# Patient Record
Sex: Female | Born: 2000 | Race: Black or African American | Hispanic: No | Marital: Single | Smoking: Former smoker
Health system: Southern US, Community
[De-identification: ages and names within clinical notes are randomized; demographics above are authoritative.]

## PROBLEM LIST (undated history)

## (undated) DIAGNOSIS — G35 Multiple sclerosis: Secondary | ICD-10-CM

## (undated) DIAGNOSIS — G35D Multiple sclerosis, unspecified: Secondary | ICD-10-CM

## (undated) DIAGNOSIS — N926 Irregular menstruation, unspecified: Secondary | ICD-10-CM

## (undated) DIAGNOSIS — J45909 Unspecified asthma, uncomplicated: Secondary | ICD-10-CM

## (undated) DIAGNOSIS — H5213 Myopia, bilateral: Secondary | ICD-10-CM

## (undated) DIAGNOSIS — E669 Obesity, unspecified: Secondary | ICD-10-CM

## (undated) DIAGNOSIS — Q6689 Other  specified congenital deformities of feet: Secondary | ICD-10-CM

## (undated) DIAGNOSIS — E7800 Pure hypercholesterolemia, unspecified: Secondary | ICD-10-CM

## (undated) DIAGNOSIS — Z9189 Other specified personal risk factors, not elsewhere classified: Secondary | ICD-10-CM

## (undated) HISTORY — DX: Irregular menstruation, unspecified: N92.6

## (undated) HISTORY — DX: Unspecified asthma, uncomplicated: J45.909

## (undated) HISTORY — DX: Obesity, unspecified: E66.9

## (undated) HISTORY — DX: Other specified personal risk factors, not elsewhere classified: Z91.89

## (undated) HISTORY — DX: Myopia, bilateral: H52.13

## (undated) HISTORY — DX: Pure hypercholesterolemia, unspecified: E78.00

## (undated) HISTORY — DX: Other specified congenital deformities of feet: Q66.89

---
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
## 2018-05-11 ENCOUNTER — Ambulatory Visit: Payer: Medicaid Other | Admitting: Pediatrics

## 2018-05-11 ENCOUNTER — Encounter: Payer: Medicaid Other | Admitting: Licensed Clinical Social Worker

## 2018-07-20 ENCOUNTER — Emergency Department (HOSPITAL_COMMUNITY): Payer: Medicaid Other

## 2018-07-20 ENCOUNTER — Other Ambulatory Visit: Payer: Self-pay

## 2018-07-20 ENCOUNTER — Encounter (HOSPITAL_COMMUNITY): Payer: Self-pay

## 2018-07-20 ENCOUNTER — Emergency Department (HOSPITAL_COMMUNITY)
Admission: EM | Admit: 2018-07-20 | Discharge: 2018-07-20 | Disposition: A | Discharge status: Home / Self Care | Payer: Medicaid Other | Attending: Emergency Medicine | Admitting: Emergency Medicine

## 2018-07-20 DIAGNOSIS — Z7722 Contact with and (suspected) exposure to environmental tobacco smoke (acute) (chronic): Secondary | ICD-10-CM | POA: Insufficient documentation

## 2018-07-20 DIAGNOSIS — J45909 Unspecified asthma, uncomplicated: Secondary | ICD-10-CM | POA: Insufficient documentation

## 2018-07-20 DIAGNOSIS — K5900 Constipation, unspecified: Secondary | ICD-10-CM

## 2018-07-20 DIAGNOSIS — R102 Pelvic and perineal pain: Secondary | ICD-10-CM

## 2018-07-20 DIAGNOSIS — Z79899 Other long term (current) drug therapy: Secondary | ICD-10-CM | POA: Insufficient documentation

## 2018-07-20 DIAGNOSIS — R109 Unspecified abdominal pain: Secondary | ICD-10-CM

## 2018-07-20 LAB — URINALYSIS, ROUTINE W REFLEX MICROSCOPIC
Bilirubin Urine: NEGATIVE
Glucose, UA: NEGATIVE mg/dL
Hgb urine dipstick: NEGATIVE
Ketones, ur: 5 mg/dL — AB
NITRITE: NEGATIVE
PH: 5 (ref 5.0–8.0)
Protein, ur: NEGATIVE mg/dL
Specific Gravity, Urine: 1.026 (ref 1.005–1.030)

## 2018-07-20 LAB — POC URINE PREG, ED: Preg Test, Ur: NEGATIVE

## 2018-07-20 MED ORDER — MAGNESIUM CITRATE PO SOLN: 1.0000 | Freq: Once | ORAL | 0 refills | Status: AC

## 2018-07-20 MED ORDER — POLYETHYLENE GLYCOL 3350 17 G PO PACK: 17.0000 g | PACK | Freq: Every day | ORAL | 0 refills | Status: DC

## 2018-07-20 NOTE — ED Notes (Signed)
Patient transported to Ultrasound 

## 2018-07-20 NOTE — ED Provider Notes (Signed)
[facility] EMERGENCY DEPARTMENT Provider Note   CSN: [id number] Arrival date & time: 07/20/18  1525     History   Chief Complaint Chief Complaint  Patient presents with  . Abdominal Pain    HPI Dana Woods is a 17 y.o. female.   Abdominal Pain   This is a new problem. The current episode started less than 1 hour ago. The problem occurs constantly. The problem has not changed since onset.The pain is located in the LLQ and RLQ. The quality of the pain is aching and pressure-like. The pain is moderate. Nothing aggravates the symptoms. Nothing relieves the symptoms. Past workup does not include GI consult or ultrasound.    Past Medical History:  Diagnosis Date  . Asthma   . At risk for diabetes mellitus 12/2009   HbA1c 5.7  . Hypercholesteremia 12/2009   LDL 116, totat 181  . Irregular menses   . 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 Obesity 2009  . 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 Acanthosis nigricans 11/08/2016  . Ganglion cyst 09/20/2013  . Hypercholesteremia 04/12/2013  . Infection of urinary tract 03/20/2013  . Obesity peds (BMI >=95 percentile) 03/20/2013    History reviewed. No pertinent surgical history.   OB History   None      Home Medications    Prior to Admission medications   Medication Sig Start Date End Date Taking? Authorizing Provider  dextromethorphan-guaiFENesin (MUCINEX DM) 30-600 MG 12hr tablet Take 1 tablet by mouth 2 (two) times daily. 11/02/17   Linus Mako B, NP  ipratropium (ATROVENT) 0.06 % nasal spray Place 2 sprays into both nostrils 3 (three) times daily. 11/02/17   Georgetta Haber, NP  magnesium citrate SOLN Take 296 mLs (1 Bottle total) by mouth once for 1 dose. 07/20/18 07/20/18  Galena Logie, Barbara Cower, MD  polyethylene glycol (MIRALAX / GLYCOLAX) packet Take 17 g by mouth daily. 07/20/18   Shamere Campas, Barbara Cower, MD    Family History Family History  Problem  Relation Age of Onset  . ADD / ADHD Sister     Social History Social History   Tobacco Use  . Smoking status: Passive Smoke Exposure - Never Smoker  . Smokeless tobacco: Never Used  Substance Use Topics  . Alcohol use: Not on file  . Drug use: Not on file     Allergies   Patient has no known allergies.   Review of Systems Review of Systems  Gastrointestinal: Positive for abdominal pain.  All other systems reviewed and are negative.    Physical Exam Updated Vital Signs BP 105/88 (BP Location: Right Arm)   Pulse 79   Temp 98.1 F (36.7 C) (Oral)   Resp 18   SpO2 100%   Physical Exam  Constitutional: She is oriented to person, place, and time. She appears well-developed and well-nourished.  HENT:  Head: Normocephalic and atraumatic.  Neck: Normal range of motion.  Cardiovascular: Normal rate and regular rhythm.  Pulmonary/Chest: Effort normal. No stridor. No respiratory distress.  Abdominal: Normal appearance and bowel sounds are normal. She exhibits no distension. There is tenderness in the right lower quadrant and left lower quadrant.  Neurological: She is alert and oriented to person, place, and time.  Skin: Skin is warm and dry.  Nursing note and vitals reviewed.    ED Treatments / Results  Labs (all labs ordered are listed, but  only abnormal results are displayed) Labs Reviewed  URINALYSIS, ROUTINE W REFLEX MICROSCOPIC - Abnormal; Notable for the following components:      Result Value   Color, Urine AMBER (*)    APPearance CLOUDY (*)    Ketones, ur 5 (*)    Leukocytes, UA SMALL (*)    Bacteria, UA FEW (*)    All other components within normal limits  POC URINE PREG, ED    EKG None  Radiology US Pelvis (transabdominal Only)  Result Date: 07/20/2018 CLINICAL DATA:  Pelvic pain. EXAM: TRANSABDOMINAL ULTRASOUND OF PELVIS DOPPLER ULTRASOUND OF OVARIES TECHNIQUE: Transabdominal ultrasound examination of the pelvis was performed including evaluation  of the uterus, ovaries, adnexal regions, and pelvic cul-de-sac. Color and duplex Doppler ultrasound was utilized to evaluate blood flow to the ovaries. COMPARISON:  None. FINDINGS: Uterus Measurements: 7.9 x 3.2 x 4.3 cm = volume: 57.2 mL. No fibroids or other mass visualized. Endometrium Thickness: 0.5.  No focal abnormality visualized. Right ovary Measurements: 2.8 x 2.6 x 2.4 cm = volume: 9.3 mL. Normal appearance/no adnexal mass. Left ovary Not visualized Pulsed Doppler evaluation demonstrates normal low-resistance arterial and venous waveforms in the right ovary. Other: No free pelvic fluid. The examination is limited by the patient's body habitus. IMPRESSION: The left ovary was not visualized. The examination is otherwise negative. No right ovarian torsion. Electronically Signed   By: Drusilla Kanner M.D.   On: 07/20/2018 17:26   US Pelvic Doppler (torsion R/o Or Mass Arterial Flow)  Result Date: 07/20/2018 CLINICAL DATA:  Pelvic pain. EXAM: TRANSABDOMINAL ULTRASOUND OF PELVIS DOPPLER ULTRASOUND OF OVARIES TECHNIQUE: Transabdominal ultrasound examination of the pelvis was performed including evaluation of the uterus, ovaries, adnexal regions, and pelvic cul-de-sac. Color and duplex Doppler ultrasound was utilized to evaluate blood flow to the ovaries. COMPARISON:  None. FINDINGS: Uterus Measurements: 7.9 x 3.2 x 4.3 cm = volume: 57.2 mL. No fibroids or other mass visualized. Endometrium Thickness: 0.5.  No focal abnormality visualized. Right ovary Measurements: 2.8 x 2.6 x 2.4 cm = volume: 9.3 mL. Normal appearance/no adnexal mass. Left ovary Not visualized Pulsed Doppler evaluation demonstrates normal low-resistance arterial and venous waveforms in the right ovary. Other: No free pelvic fluid. The examination is limited by the patient's body habitus. IMPRESSION: The left ovary was not visualized. The examination is otherwise negative. No right ovarian torsion. Electronically Signed   By: Drusilla Kanner M.D.   On: 07/20/2018 17:26    Procedures Procedures (including critical care time)  Medications Ordered in ED Medications - No data to display   Initial Impression / Assessment and Plan / ED Course  I have reviewed the triage vital signs and the nursing notes.  Pertinent labs & imaging results that were available during my care of the patient were reviewed by me and considered in my medical decision making (see chart for details).     17 year old female without any evidence of pelvic pathology.  Possibly constipation.  Possibly muscular.  Will follow up with primary doctor.  Low suspicion for appendicitis, diverticulitis or other emergent intra-abdominal pathology.  Final Clinical Impressions(s) / ED Diagnoses   Final diagnoses:  Pelvic pain  Abdominal pain, unspecified abdominal location  Constipation, unspecified constipation type    ED Discharge Orders         Ordered    polyethylene glycol (MIRALAX / GLYCOLAX) packet  Daily     07/20/18 1753    magnesium citrate SOLN   Once     07/20/18  1753           Marily MemosMesner, Chastelyn [place], MD 07/20/18 [phone]

## 2018-07-20 NOTE — ED Triage Notes (Signed)
Pt reports lower abd pain when she urinates and rectal pain when she has a BM. Dark and foul odor noted in urine. No n.v. Pain increases with palpation.

## 2019-02-08 ENCOUNTER — Encounter: Payer: Self-pay | Admitting: Pediatrics

## 2019-02-08 ENCOUNTER — Ambulatory Visit (INDEPENDENT_AMBULATORY_CARE_PROVIDER_SITE_OTHER): Payer: Medicaid Other | Admitting: Pediatrics

## 2019-02-08 ENCOUNTER — Other Ambulatory Visit: Payer: Self-pay

## 2019-02-08 VITALS — BP 121/80 | HR 91 | Ht 66.14 in | Wt 320.0 lb

## 2019-02-08 DIAGNOSIS — Z6841 Body Mass Index (BMI) 40.0 and over, adult: Secondary | ICD-10-CM | POA: Diagnosis not present

## 2019-02-08 DIAGNOSIS — F322 Major depressive disorder, single episode, severe without psychotic features: Secondary | ICD-10-CM

## 2019-02-08 DIAGNOSIS — Z113 Encounter for screening for infections with a predominantly sexual mode of transmission: Secondary | ICD-10-CM

## 2019-02-08 DIAGNOSIS — Z0001 Encounter for general adult medical examination with abnormal findings: Secondary | ICD-10-CM | POA: Diagnosis not present

## 2019-02-08 DIAGNOSIS — R21 Rash and other nonspecific skin eruption: Secondary | ICD-10-CM

## 2019-02-08 LAB — POCT RAPID HIV: Rapid HIV, POC: NEGATIVE

## 2019-02-08 MED ORDER — FLUOXETINE HCL 20 MG PO CAPS: 20.0000 mg | ORAL_CAPSULE | Freq: Every day | ORAL | 0 refills | Status: DC

## 2019-02-08 MED ORDER — MUPIROCIN 2 % EX OINT: 1.0000 "application " | TOPICAL_OINTMENT | Freq: Two times a day (BID) | CUTANEOUS | 0 refills | Status: DC

## 2019-02-08 NOTE — Progress Notes (Signed)
Adolescent Well Care Visit Dana Woods is a 18 y.o. female who is here for well care.    PCP:  Roselind Messier, MD   History was provided by the patient. 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 Gets to face time iwht her due to the ex girlfriends mother's support and making it possible  Did think about hurting herself when they first broke up. Didn't have a plan. Afriend or family member saw  A social media post about wanting to hurt herself and called right away. Part of what kept her from hurting herself is the little girl    Nutrition: Nutrition/Eating Behaviors: eats well sometime, smoothies,  And mom food, --tends to be heavieer Fruit and  No longer sweet tea, no soda,  Not more drinks with sugar 2018 moved to [place]--ate healthy there   Exercise/ Media: Lots of walking on job , Walk for exercise Weight is going up not sure why  Sleep:  Sleep: sleep with music on No tv while sleep   Menses--they are ok, irregular  No longer smoking marijuana Smokes cigarette--every 2-3 weeks for a pack Her mom smokes a lot No vaping  No new relationship since broke up with girl friend  The patient completed the Rapid Assessment for Adolescent Preventive Services screening questionnaire and the following topics  were identified as risk factors and discussed: healthy eating, exercise, tobacco use and sexuality  In addition, the following topics were discussed as part of anticipatory guidance suicidality/self harm and mental health issues.  PHQ-9 completed and results indicated --score 9  Physical Exam:  Vitals:   02/08/19 1506  BP: 121/80  Pulse: 91  Weight: (!) 320 lb (145.2 kg)  Height: 5' 6.14" (1.68 m)   BP 121/80   Pulse 91   Ht 5' 6.14" (1.68 m)   Wt (!) 320 lb (145.2 kg)   LMP 02/01/2019   BMI 51.43 kg/m  Body mass index: body mass index is 51.43 kg/m. Growth percentile SmartLinks can only be used for patients less than 63 years old.   Hearing Screening   Method: Audiometry   125Hz  250Hz  500Hz  1000Hz  2000Hz  3000Hz  4000Hz  6000Hz  8000Hz   Right ear:   20 20 20  20     Left ear:   20 20 20  20       Visual Acuity Screening   Right eye Left eye Both eyes  Without correction: 20/20 20/20 20/20   With correction:       General Appearance:   alert, oriented, no acute distress  HENT: Normocephalic, no obvious abnormality, conjunctiva clear  Mouth:   Normal appearing teeth, no obvious discoloration, dental caries, or dental caps  Neck:   Supple; thyroid: no enlargement, symmetric, no tenderness/mass/nodules  Chest Breasts not examined  Lungs:   Clear to auscultation bilaterally, normal work of breathing  Heart:   Regular rate and rhythm, S1 and S2 normal, no murmurs;   Abdomen:   Soft, non-tender, no mass, or organomegaly  GU genitalia not examined  Musculoskeletal:   Tone and strength strong and symmetrical, all extremities               Lymphatic:   No cervical adenopathy  Skin/Hair/Nails:   Acanthosis present in fold, left upper back with one inch diamer erythema, no deep component, shallow ulcer  Neurologic:   Strength, gait, and coordination normal and age-appropriate     Assessment and Plan:   1. Encounter for well adult exam with abnormal findings  2. Current severe  episode of major depressive disorder without psychotic features without prior episode (HCC)  - FLUoxetine (PROZAC) 20 MG capsule; Take 1 capsule (20 mg total) by mouth daily.  Dispense: 30 capsule; Refill: 0 - [facility]  Not currently suicidal. Initially requested only therapy, but then accepted meds   Start 20 mg call in 2 day bof check if picked up meds   Patient and/or legal guardian [facility]ally [facility] about presenting concerns.  3. Routine screening for STI (sexually transmitted infection) - POCT Rapid HIV - C. trachomatis/N. gonorrhoeae RNA  4. BMI 50.0-59.9, adult (HCC) No insurance for lab testing   5. Rash Looks like was bullae, not clear if insect bite or is was infection first  - mupirocin ointment (BACTROBAN) 2 %; Apply 1 application topically 2 (two) times daily.  Dispense: 22 g; Refill: 0  BMI is not appropriate for age  Hearing screening result:normal Vision screening result: normal  Imm UTD  Return 2 week about video, for with Dr. H.Kiylah Loyer.Theadore Nan.  Million Maharaj, MD

## 2019-02-09 LAB — C. TRACHOMATIS/N. GONORRHOEAE RNA
C. trachomatis RNA, TMA: NOT DETECTED
N. gonorrhoeae RNA, TMA: NOT DETECTED

## 2019-02-12 ENCOUNTER — Telehealth: Payer: Self-pay | Admitting: Licensed Clinical Social Worker

## 2019-02-12 NOTE — Telephone Encounter (Signed)
[facility] received staff message asking to contact patient and f/u to confirm she received her medication and set up therapy appointment. Patient did not answer. [facility] left a voicemail.

## 2019-02-15 ENCOUNTER — Telehealth: Payer: Self-pay | Admitting: Pediatrics

## 2019-02-15 NOTE — Telephone Encounter (Signed)
Called to see if able to pick up meds and to check for side effects  Do not yet expect her to have therapeutic effects.  Unable to reach patient, Left voice message

## 2019-02-21 ENCOUNTER — Encounter: Payer: Medicaid Other | Admitting: Licensed Clinical Social Worker

## 2019-02-21 ENCOUNTER — Other Ambulatory Visit: Payer: Self-pay

## 2019-02-21 ENCOUNTER — Ambulatory Visit: Payer: Medicaid Other | Admitting: Pediatrics

## 2019-02-21 ENCOUNTER — Telehealth: Payer: Self-pay | Admitting: Licensed Clinical Social Worker

## 2019-02-21 NOTE — Telephone Encounter (Signed)
Sent link, no answer after 5 minutes. Call to patient. LVM. NS, no charge for this visit. Closing for administrative reasons. 

## 2019-02-25 ENCOUNTER — Telehealth: Payer: Self-pay | Admitting: Licensed Clinical Social Worker

## 2019-02-25 NOTE — Telephone Encounter (Signed)
Entered in error

## 2019-07-13 ENCOUNTER — Ambulatory Visit (HOSPITAL_COMMUNITY)
Admission: EM | Admit: 2019-07-13 | Discharge: 2019-07-13 | Disposition: A | Discharge status: Home / Self Care | Payer: Self-pay | Attending: Family Medicine | Admitting: Family Medicine

## 2019-07-13 ENCOUNTER — Other Ambulatory Visit: Payer: Self-pay

## 2019-07-13 ENCOUNTER — Encounter (HOSPITAL_COMMUNITY): Payer: Self-pay | Admitting: Emergency Medicine

## 2019-07-13 DIAGNOSIS — Z3202 Encounter for pregnancy test, result negative: Secondary | ICD-10-CM

## 2019-07-13 DIAGNOSIS — N3090 Cystitis, unspecified without hematuria: Secondary | ICD-10-CM

## 2019-07-13 DIAGNOSIS — R300 Dysuria: Secondary | ICD-10-CM

## 2019-07-13 LAB — POCT URINALYSIS DIP (DEVICE)
Bilirubin Urine: NEGATIVE
Glucose, UA: NEGATIVE mg/dL
Ketones, ur: 15 mg/dL — AB
Nitrite: NEGATIVE
Protein, ur: 30 mg/dL — AB
Specific Gravity, Urine: 1.02 (ref 1.005–1.030)
Urobilinogen, UA: 0.2 mg/dL (ref 0.0–1.0)
pH: 7 (ref 5.0–8.0)

## 2019-07-13 LAB — POC URINE PREG, ED: Preg Test, Ur: NEGATIVE

## 2019-07-13 LAB — POCT PREGNANCY, URINE: Preg Test, Ur: NEGATIVE

## 2019-07-13 MED ORDER — CEPHALEXIN 500 MG PO CAPS: 500.0000 mg | ORAL_CAPSULE | Freq: Two times a day (BID) | ORAL | 0 refills | Status: DC

## 2019-07-13 NOTE — ED Provider Notes (Signed)
[place]    ASSESSMENT & PLAN:  1. Dysuria   2. Cystitis     Begin: Meds ordered this encounter  Medications  . cephALEXin (KEFLEX) 500 MG capsule    Sig: Take 1 capsule (500 mg total) by mouth 2 (two) times daily.    Dispense:  10 capsule    Refill:  0    No signs of pyelonephritis. Discussed. Urine culture sent. Will notify patient when results available. Will follow up with her PCP or here if not showing improvement over the next 48 hours, sooner if needed.  Outlined signs and symptoms indicating need for more acute intervention. Patient verbalized understanding. After Visit Summary given.  SUBJECTIVE:  Dana Woods is a 18 y.o. female who complains of urinary frequency, urgency and dysuria for the past 2 days. Without associated flank pain, fever, chills, vaginal discharge or bleeding. Gross hematuria: not present. No specific aggravating or alleviating factors reported. No LE edema. Normal PO intake without n/v/d. Without specific abdominal pain. Ambulatory without difficulty. OTC treatment: none. H/O UTI: rare.  LMP: Patient's last menstrual period was 07/01/2019.  ROS: As in HPI. All other systems negative.   OBJECTIVE:  Vitals:   07/13/19 1134  BP: (!) 101/59  Pulse: 91  Resp: 20  Temp: 98.1 F (36.7 C)  TempSrc: Oral  SpO2: 97%   General appearance: alert; no distress HENT: oropharynx: moist Lungs: unlabored respirations Abdomen: soft, non-tender; bowel sounds normal; no masses or organomegaly; no guarding or rebound tenderness Back: no CVA tenderness Extremities: no edema; symmetrical with no gross deformities Skin: warm and dry Neurologic: normal gait Psychological: alert and cooperative; normal mood and affect  Labs Reviewed  POCT URINALYSIS DIP (DEVICE) - Abnormal; Notable for the following components:      Result Value   Ketones, ur 15 (*)    Hgb urine dipstick SMALL (*)    Protein, ur 30 (*)    Leukocytes,Ua LARGE (*)    All other components within normal limits  POCT PREGNANCY, URINE  POC URINE PREG, ED    No Known Allergies  Past Medical History:  Diagnosis Date  . Asthma   . At risk for diabetes mellitus 12/2009   HbA1c 5.7  . Hypercholesteremia 12/2009   LDL 116, totat 181  . Irregular menses   . 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 Obesity 2009  . 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 Marital status: Single    Spouse name: Not on file  . Number of children: Not on file  . Years of education: Not on file  . Highest education level: Not on file  Occupational History  . Not on file  Social Needs  . Financial resource strain: Not on file  . Food insecurity    Worry: Not on file    Inability: Not on file  . Transportation needs    Medical: Not on file    Non-medical: Not on file  Tobacco Use  . Smoking status: Current Some Day Smoker  . Smokeless tobacco: Never Used  Substance and Sexual Activity  . Alcohol use: Not Currently  . Drug use: Never  . Sexual activity: Not on file  Lifestyle  . Physical activity    Days per week: Not on file    Minutes per session: Not on file  . Stress: Not on file  Relationships  . Social connections  Talks on phone: Not on file    Gets together: Not on file    Attends religious service: Not on file    Active member of club or organization: Not on file    Attends meetings of clubs or organizations: Not on file    Relationship status: Not on file  . Intimate partner violence    Fear of current or ex partner: Not on file    Emotionally abused: Not on file    Physically abused: Not on file    Forced sexual activity: Not on file  Other Topics Concern  . Not on file  Social History Narrative   Lives with mom, and sister Demetria. Mom smokes inside.   Dad lives nearby with new wife.    Family History  Problem Relation Age of Onset  . ADD / ADHD Zadie Rhine, MD 07/13/19 1212

## 2019-07-13 NOTE — ED Triage Notes (Signed)
Pain with urination started 2 days ago.  Patient has low abdominal pain.  Patient has small amounts of urine when going to bathroom

## 2020-07-03 ENCOUNTER — Emergency Department (HOSPITAL_COMMUNITY)
Admission: EM | Admit: 2020-07-03 | Discharge: 2020-07-03 | Disposition: A | Discharge status: Home / Self Care | Payer: Self-pay | Attending: Emergency Medicine | Admitting: Emergency Medicine

## 2020-07-03 ENCOUNTER — Other Ambulatory Visit: Payer: Self-pay

## 2020-07-03 ENCOUNTER — Encounter (HOSPITAL_COMMUNITY): Payer: Self-pay | Admitting: Emergency Medicine

## 2020-07-03 ENCOUNTER — Emergency Department (HOSPITAL_COMMUNITY): Payer: Self-pay

## 2020-07-03 DIAGNOSIS — N3091 Cystitis, unspecified with hematuria: Secondary | ICD-10-CM | POA: Insufficient documentation

## 2020-07-03 DIAGNOSIS — F17210 Nicotine dependence, cigarettes, uncomplicated: Secondary | ICD-10-CM | POA: Insufficient documentation

## 2020-07-03 DIAGNOSIS — J45909 Unspecified asthma, uncomplicated: Secondary | ICD-10-CM | POA: Insufficient documentation

## 2020-07-03 DIAGNOSIS — Z79899 Other long term (current) drug therapy: Secondary | ICD-10-CM | POA: Insufficient documentation

## 2020-07-03 DIAGNOSIS — N3001 Acute cystitis with hematuria: Secondary | ICD-10-CM

## 2020-07-03 LAB — BASIC METABOLIC PANEL
Anion gap: 12 (ref 5–15)
BUN: 10 mg/dL (ref 6–20)
CO2: 19 mmol/L — ABNORMAL LOW (ref 22–32)
Calcium: 9.4 mg/dL (ref 8.9–10.3)
Chloride: 105 mmol/L (ref 98–111)
Creatinine, Ser: 0.82 mg/dL (ref 0.44–1.00)
GFR, Estimated: >60 mL/min (ref >60)
Glucose, Bld: 89 mg/dL (ref 70–99)
Potassium: 4.1 mmol/L (ref 3.5–5.1)
Sodium: 136 mmol/L (ref 135–145)

## 2020-07-03 LAB — URINALYSIS, ROUTINE W REFLEX MICROSCOPIC
Bilirubin Urine: NEGATIVE
Glucose, UA: NEGATIVE mg/dL
Ketones, ur: NEGATIVE mg/dL
Nitrite: NEGATIVE
Protein, ur: 30 mg/dL — AB
Specific Gravity, Urine: 1.024 (ref 1.005–1.030)
WBC, UA: >50 WBC/hpf — ABNORMAL HIGH (ref 0–5)
pH: 6 (ref 5.0–8.0)

## 2020-07-03 LAB — I-STAT BETA HCG BLOOD, ED (MC, WL, AP ONLY): I-stat hCG, quantitative: <5 m[IU]/mL (ref <5)

## 2020-07-03 MED ORDER — SODIUM CHLORIDE (PF) 0.9 % IJ SOLN
INTRAMUSCULAR | Status: AC
  Filled 2020-07-03: qty 50

## 2020-07-03 MED ORDER — CEPHALEXIN 500 MG PO CAPS: 500.0000 mg | ORAL_CAPSULE | Freq: Two times a day (BID) | ORAL | 0 refills | Status: AC

## 2020-07-03 MED ORDER — IOHEXOL 300 MG/ML  SOLN
100.0000 mL | Freq: Once | INTRAMUSCULAR | Status: AC | PRN
  Administered 2020-07-03: 100 mL via INTRAVENOUS

## 2020-07-03 NOTE — ED Triage Notes (Signed)
Went to get a physical and got told she had blood, protein and ketones in her urine, was told to get trt. Endorses painful urination, dark yellow urine. Denies vaginal discharge or odor.

## 2020-07-03 NOTE — ED Notes (Signed)
Patient transported to CT at this time. 

## 2020-07-03 NOTE — Discharge Instructions (Addendum)
You have been diagnosed with a UTI.  I prescribed you antibiotics please take as prescribed.  Recommend continue to stay hydrated as this will help with your urinary tract infection.  For pain you may take over-the-counter pain medications like ibuprofen or Tylenol every 6 as needed please follow dosing the back of bottle.  Please follow-up with your PCP for further evaluation.  Come back to the emergency department if you develop chest pain, shortness of breath, severe abdominal pain, uncontrolled nausea, vomiting, diarrhea.

## 2020-07-03 NOTE — ED Provider Notes (Addendum)
[facility]-EMERGENCY DEPT Provider Note   CSN: [id number] Arrival date & time: 07/03/20  7939     History No chief complaint on file.   Dana Woods is a 19 y.o. female.  HPI   Patient with significant medical history of asthma, obesity presents to the emergency department with chief complaint of dysuria x1 month.  Patient states when she urinates she has a burning sensation. she explains she has increase in frequency and urinary hesitancy as urination causes pain forcing her to stop urinating.  She denies hematuria, vaginal bleeding, vaginal discharge, or pelvic pain.  She also endorses some slight groin pain on the left lower quadrant, but denies any associated flank pain, abdominal pain, nausea, vomiting, diarrhea or constipation.  She denies fevers or chills, difficulty tolerating p.o., has never had a UTI or pyelonephritis, denies history of kidney stones or other abdominal surgeries.  She endorses her last menstrual cycle was last month and was normal.  She states she was seen at her primary care office today and they told her that she had proteins and ketones in her urine.  She denies any alleviating factors at this time.  Patient denies headaches, fevers, chills, shortness of breath, chest pain, abdominal pain, nausea, vomiting, diarrhea, pedal edema.  Past Medical History:  Diagnosis Date  . Asthma   . At risk for diabetes mellitus 12/2009   HbA1c 5.7  . Hypercholesteremia 12/2009   LDL 116, totat 181  . Irregular menses   . 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 Obesity 2009  . 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 Current severe episode of major depressive disorder without psychotic features without prior episode (HCC) 02/08/2019  . Acanthosis nigricans 11/08/2016  . Ganglion cyst 09/20/2013  . Hypercholesteremia 04/12/2013  . 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 No pertinent surgical history.   OB History   No obstetric history on file.     Family History  Problem Relation Age of Onset  . ADD / ADHD Sister     Social History   Tobacco Use  . Smoking status: Current Some Day Smoker  . Smokeless tobacco: Never Used  Substance Use Topics  . Alcohol use: Not Currently  . Drug use: Never    Home Medications Prior to Admission medications   Medication Sig Start Date End Date Taking? Authorizing Provider  cephALEXin (KEFLEX) 500 MG capsule Take 1 capsule (500 mg total) by mouth 2 (two) times daily. 07/13/19   Mardella Layman, MD  cephALEXin (KEFLEX) 500 MG capsule Take 1 capsule (500 mg total) by mouth 2 (two) times daily for 7 days. 07/03/20 07/10/20  Carroll Sage, PA-C  FLUoxetine (PROZAC) 20 MG capsule Take 1 capsule (20 mg total) by mouth daily. 02/08/19 07/13/19  Theadore Nan, MD  ipratropium (ATROVENT) 0.06 % nasal spray Place 2 sprays into both nostrils 3 (three) times daily. 11/02/17 07/13/19  Georgetta Haber, NP    Allergies    Patient has no known allergies.  Review of Systems   Review of Systems  Constitutional: Negative for chills and fever.  HENT: Negative for congestion.   Respiratory: Negative for shortness of breath.   Cardiovascular: Negative for chest pain.  Gastrointestinal: Negative for abdominal pain, constipation, diarrhea, nausea and vomiting.  Genitourinary: Positive for dysuria and frequency. Negative for enuresis, flank pain, hematuria, pelvic pain,  vaginal bleeding and vaginal discharge.  Musculoskeletal: Negative for back pain.  Skin: Negative for rash.  Neurological: Negative for dizziness.  Hematological: Does not bruise/bleed easily.    Physical Exam Updated Vital Signs BP 126/83 (BP Location: Right Arm)   Pulse 78   Temp 98.3 F (36.8 C) (Oral)   Resp 16   Ht 5\' 6"  (1.676 m)   Wt 131.5 kg   LMP 06/10/2020   SpO2 100%   BMI 46.81 kg/m   Physical Exam Vitals and nursing  note reviewed.  Constitutional:      General: She is not in acute distress.    Appearance: She is not ill-appearing.  HENT:     Head: Normocephalic and atraumatic.     Nose: No congestion.  Eyes:     Conjunctiva/sclera: Conjunctivae normal.  Cardiovascular:     Rate and Rhythm: Normal rate and regular rhythm.     Pulses: Normal pulses.     Heart sounds: No murmur heard.  No friction rub. No gallop.   Pulmonary:     Effort: No respiratory distress.     Breath sounds: No wheezing, rhonchi or rales.  Abdominal:     General: There is no distension.     Palpations: Abdomen is soft.     Tenderness: There is abdominal tenderness. There is no right CVA tenderness, left CVA tenderness or guarding.     Comments: Patient abdomen was nondistended, normoactive bowel sounds, dull to percussion.  She had slight tenderness to palpation in her left lower quadrant slight pain in her right lower quadrant but states mainly it is in her left quadrant.  She had no rebound tenderness, negative McBurney point, no peritoneal sign noted.  Musculoskeletal:     Right lower leg: No edema.     Left lower leg: No edema.  Skin:    General: Skin is warm and dry.  Neurological:     Mental Status: She is alert.  Psychiatric:        Mood and Affect: Mood normal.     ED Results / Procedures / Treatments   Labs (all labs ordered are listed, but only abnormal results are displayed) Labs Reviewed  URINALYSIS, ROUTINE W REFLEX MICROSCOPIC - Abnormal; Notable for the following components:      Result Value   APPearance HAZY (*)    Hgb urine dipstick MODERATE (*)    Protein, ur 30 (*)    Leukocytes,Ua MODERATE (*)    WBC, UA >50 (*)    Bacteria, UA MANY (*)    All other components within normal limits  BASIC METABOLIC PANEL - Abnormal; Notable for the following components:   CO2 19 (*)    All other components within normal limits  URINE CULTURE  I-STAT BETA HCG BLOOD, ED ([facility], [facility], [facility] ONLY)     EKG None  Radiology CT ABDOMEN PELVIS W CONTRAST  Result Date: 07/03/2020 CLINICAL DATA:  19 year old female with abdominal pain, painful urination, dark urine, abnormal urinalysis. EXAM: CT ABDOMEN AND PELVIS WITH CONTRAST TECHNIQUE: Multidetector CT imaging of the abdomen and pelvis was performed using the standard protocol following bolus administration of intravenous contrast. CONTRAST:  36 OMNIPAQUE IOHEXOL 300 MG/ML  SOLN COMPARISON:  Pelvis ultrasound 07/20/2018. FINDINGS: Lower chest: Mild atelectasis or scarring at the left lung base. Cardiac size at the upper limits of normal. No pericardial or pleural effusion. Hepatobiliary: Negative liver and gallbladder. Pancreas: Negative. Spleen: Negative. Adrenals/Urinary Tract: Normal adrenal glands. Renal enhancement appears symmetric and within  normal limits, and there is no perinephric inflammatory stranding. No hydronephrosis. No nephrolithiasis. Mild circumferential abnormal bladder wall thickening and mild perivesical inflammatory stranding at the bladder dome (sagittal image 79). Stomach/Bowel: Negative large bowel, with normal appendix visible on coronal image 45. Negative terminal ileum. No dilated or abnormal small bowel. Stomach and duodenum are decompressed. No free air. No free fluid in the abdomen. Vascular/Lymphatic: Suboptimal intravascular contrast bolus but the major arterial structures appear patent, normal. No lymphadenopathy. Reproductive: Negative. Other: Small volume of pelvic free fluid with simple fluid density in the cul-de-sac. Musculoskeletal: Mild lower thoracic dextroconvex scoliosis. Otherwise negative. IMPRESSION: 1. Mild bladder wall thickening and inflammatory stranding over the bladder dome compatible with Acute UTI/ Cystitis. No evidence of renal involvement, obstructive uropathy. 2. Small volume of pelvic free fluid is likely physiologic. 3. Normal appendix. Electronically Signed   By: Odessa Fleming M.D.   On:  07/03/2020 15:24    Procedures Procedures (including critical care time)  Medications Ordered in ED Medications  sodium chloride (PF) 0.9 % injection (has no administration in time range)  iohexol (OMNIPAQUE) 300 MG/ML solution 100 mL (100 mLs Intravenous Contrast Given 07/03/20 1506)    ED Course  I have reviewed the triage vital signs and the nursing notes.  Pertinent labs & imaging results that were available during my care of the patient were reviewed by me and considered in my medical decision making (see chart for details).    MDM Rules/Calculators/A&P                          Patient presents with urinary symptoms.  She was alert, does not appear in acute distress, vital signs reassuring.  Will order basic labs and imaging for further evaluation.  BMP negative for electrolyte abnormalities, slight metabolic acidosis with a CO2 of 19, no AKI, no anion gap noted.  hCG was less than 5, UA shows proteins, moderate leukocytes, slight red blood cells, many white blood cells, many bacteria.  We will send for urine culture.  CT abdomen pelvis shows mild bladder wall thickening and inflammation stranding over the bladder dome consistent with acute UTI/cystitis.  No other acute abnormalities noted.  Low suspicion for systemic infection as patient is nontoxic-appearing, vital signs reassuring. Low suspicion for pancreatitis, diverticulitis, hepatic or biliary abnormalities, bowel obstruction, bowel perforation, colitis, appendicitis as there is no acute abdomen on exam, CT abdomen pelvis shows only acute cystitis no other abnormalities found. Low suspicion for pyelonephritis,  negative CVA tenderness, CT scan negative for pyelonephritis.  I suspect patient suffering from uncomplicated UTI.  We will start her on antibiotics have her follow-up with PCP for further evaluation management.  Vital signs have remained stable, no indication for hospital admission.  Patient given at home care as well  strict return precautions.  Patient verbalized that they understood agreed to said plan.    Final Clinical Impression(s) / ED Diagnoses Final diagnoses:  Acute cystitis with hematuria    Rx / DC Orders ED Discharge Orders         Ordered    cephALEXin (KEFLEX) 500 MG capsule  2 times daily        07/03/20 1528           Carroll Sage, PA-C 07/03/20 1537    Carroll Sage, PA-C 07/03/20 1537    Linwood Dibbles, MD 07/04/20 [phone]

## 2020-07-03 NOTE — ED Notes (Signed)
Verbalized understanding discharge instructions and prescriptions. In no acute distress.   

## 2020-07-05 LAB — URINE CULTURE

## 2021-01-14 ENCOUNTER — Encounter: Payer: Medicaid Other | Admitting: Obstetrics & Gynecology

## 2021-03-10 ENCOUNTER — Encounter: Payer: Self-pay | Admitting: Obstetrics & Gynecology

## 2021-03-10 ENCOUNTER — Other Ambulatory Visit: Payer: Self-pay

## 2021-03-10 ENCOUNTER — Other Ambulatory Visit (HOSPITAL_COMMUNITY)
Admission: RE | Admit: 2021-03-10 | Discharge: 2021-03-10 | Disposition: A | Discharge status: Home / Self Care | Payer: Medicaid Other | Source: Ambulatory Visit | Attending: Obstetrics & Gynecology | Admitting: Obstetrics & Gynecology

## 2021-03-10 ENCOUNTER — Ambulatory Visit (INDEPENDENT_AMBULATORY_CARE_PROVIDER_SITE_OTHER): Payer: Medicaid Other | Admitting: Obstetrics & Gynecology

## 2021-03-10 VITALS — BP 117/76 | HR 84 | Wt 298.0 lb

## 2021-03-10 DIAGNOSIS — Z01419 Encounter for gynecological examination (general) (routine) without abnormal findings: Secondary | ICD-10-CM | POA: Insufficient documentation

## 2021-03-10 DIAGNOSIS — Z30011 Encounter for initial prescription of contraceptive pills: Secondary | ICD-10-CM | POA: Diagnosis not present

## 2021-03-10 DIAGNOSIS — Z3009 Encounter for other general counseling and advice on contraception: Secondary | ICD-10-CM

## 2021-03-10 DIAGNOSIS — Z113 Encounter for screening for infections with a predominantly sexual mode of transmission: Secondary | ICD-10-CM | POA: Diagnosis present

## 2021-03-10 MED ORDER — NORETHIN ACE-ETH ESTRAD-FE 1-20 MG-MCG(24) PO TABS: 1.0000 | ORAL_TABLET | Freq: Every day | ORAL | 11 refills | Status: DC

## 2021-03-10 NOTE — Patient Instructions (Addendum)
Preventive Care 21-22 Years Old, Female Preventive care refers to lifestyle choices and visits with your health care provider that can promote health and wellness. This includes: A yearly physical exam. This is also called an annual wellness visit. Regular dental and eye exams. Immunizations. Screening for certain conditions. Healthy lifestyle choices, such as: Eating a healthy diet. Getting regular exercise. Not using drugs or products that contain nicotine and tobacco. Limiting alcohol use. What can I expect for my preventive care visit? Physical exam Your health care provider may check your: Height and weight. These may be used to calculate your BMI (body mass index). BMI is a measurement that tells if you are at a healthy weight. Heart rate and blood pressure. Body temperature. Skin for abnormal spots. Counseling Your health care provider may ask you questions about your: Past medical problems. Family's medical history. Alcohol, tobacco, and drug use. Emotional well-being. Home life and relationship well-being. Sexual activity. Diet, exercise, and sleep habits. Work and work environment. Access to firearms. Method of birth control. Menstrual cycle. Pregnancy history. What immunizations do I need?  Vaccines are usually given at various ages, according to a schedule. Your health care provider will recommend vaccines for you based on your age, medicalhistory, and lifestyle or other factors, such as travel or where you work. What tests do I need?  Blood tests Lipid and cholesterol levels. These may be checked every 5 years starting at age 20. Hepatitis C test. Hepatitis B test. Screening Diabetes screening. This is done by checking your blood sugar (glucose) after you have not eaten for a while (fasting). STD (sexually transmitted disease) testing, if you are at risk. BRCA-related cancer screening. This may be done if you have a family history of breast, ovarian, tubal, or  peritoneal cancers. Pelvic exam and Pap test. This may be done every 3 years starting at age 21. Starting at age 30, this may be done every 5 years if you have a Pap test in combination with an HPV test. Talk with your health care provider about your test results, treatment options,and if necessary, the need for more tests. Follow these instructions at home: Eating and drinking  Eat a healthy diet that includes fresh fruits and vegetables, whole grains, lean protein, and low-fat dairy products. Take vitamin and mineral supplements as recommended by your health care provider. Do not drink alcohol if: Your health care provider tells you not to drink. You are pregnant, may be pregnant, or are planning to become pregnant. If you drink alcohol: Limit how much you have to 0-1 drink a day. Be aware of how much alcohol is in your drink. In the [place] one drink equals one 12 oz bottle of beer (355 mL), one 5 oz glass of wine (148 mL), or one 1 oz glass of hard liquor (44 mL).  Lifestyle Take daily care of your teeth and gums. Brush your teeth every morning and night with fluoride toothpaste. Floss one time each day. Stay active. Exercise for at least 30 minutes 5 or more days each week. Do not use any products that contain nicotine or tobacco, such as cigarettes, e-cigarettes, and chewing tobacco. If you need help quitting, ask your health care provider. Do not use drugs. If you are sexually active, practice safe sex. Use a condom or other form of protection to prevent STIs (sexually transmitted infections). If you do not wish to become pregnant, use a form of birth control. If you plan to become pregnant, see your health care   provider for a prepregnancy visit. Find healthy ways to cope with stress, such as: Meditation, yoga, or listening to music. Journaling. Talking to a trusted person. Spending time with friends and family. Safety Always wear your seat belt while driving or riding in a  vehicle. Do not drive: If you have been drinking alcohol. Do not ride with someone who has been drinking. When you are tired or distracted. While texting. Wear a helmet and other protective equipment during sports activities. If you have firearms in your house, make sure you follow all gun safety procedures. Seek help if you have been physically or sexually abused. What's next? Go to your health care provider once a year for an annual wellness visit. Ask your health care provider how often you should have your eyes and teeth checked. Stay up to date on all vaccines. This information is not intended to replace advice given to you by your health care provider. Make sure you discuss any questions you have with your healthcare provider. Document Revised: 04/05/2020 Document Reviewed: 04/19/2018 Elsevier Patient Education  2022 Elsevier Inc.  

## 2021-03-10 NOTE — Progress Notes (Signed)
GYNECOLOGY ANNUAL PREVENTATIVE CARE ENCOUNTER NOTE  History:     Dana Woods is a 20 y.o. G0 female here for a routine annual gynecologic exam and to establish care.  Has occasionally irregular menstrual periods. Sexually active with both males and females, does not use condoms consistently and does not use any other contraception.  Denies abnormal vaginal discharge, pelvic pain, problems with intercourse or other gynecologic concerns.    Gynecologic History No LMP recorded (lmp unknown). Contraception: condoms Never had a pap smear.  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 No pertinent surgical history.  Current Outpatient Medications on File Prior to Visit  Medication Sig Dispense Refill   cephALEXin (KEFLEX) 500 MG capsule Take 1 capsule (500 mg total) by mouth 2 (two) times daily. 10 capsule 0   [DISCONTINUED] FLUoxetine (PROZAC) 20 MG capsule Take 1 capsule (20 mg total) by mouth daily. 30 capsule 0   [DISCONTINUED] ipratropium (ATROVENT) 0.06 % nasal spray Place 2 sprays into both nostrils 3 (three) times daily. 15 mL 12   No current facility-administered medications on file prior to visit.    No Known Allergies  Social History:  reports that she has been smoking cigars. She has never used smokeless tobacco. She reports previous alcohol use. She reports that she does not use drugs.  Family History  Problem Relation Age of Onset   ADD / ADHD Sister     The following portions of the patient's history were reviewed and updated as appropriate: allergies,  current medications, past family history, past medical history, past social history, past surgical history and problem list.  Review of Systems Pertinent items noted in HPI and remainder of comprehensive ROS otherwise negative.  Physical Exam:  BP 117/76   Pulse 84   Wt 298 lb (135.2 kg)   LMP  (LMP Unknown)   BMI 48.10 kg/m  CONSTITUTIONAL: Well-developed, well-nourished female in no acute distress.  HENT:  Normocephalic, atraumatic, External right and left ear normal.  EYES: Conjunctivae and EOM are normal. Pupils are equal, round, and reactive to light. No scleral icterus.  NECK: Normal range of motion, supple, no masses.  Normal thyroid.  SKIN: Skin is warm and dry. No rash noted. Not diaphoretic. No erythema. No pallor. MUSCULOSKELETAL: Normal range of motion. No tenderness.  No cyanosis, clubbing, or edema. NEUROLOGIC: Alert and oriented to person, place, and time. Normal reflexes, muscle tone coordination.  PSYCHIATRIC: Normal mood and affect. Normal behavior. Normal judgment and thought content. CARDIOVASCULAR: Normal heart rate noted, regular rhythm RESPIRATORY: Clear to auscultation bilaterally. Effort and breath sounds normal, no problems with respiration noted. BREASTS: Symmetric in size. No masses, tenderness, skin changes, nipple drainage, or lymphadenopathy bilaterally. Performed in the presence of a chaperone. ABDOMEN: Soft, obese, no distention noted.  No tenderness, rebound or guarding.  PELVIC: Normal appearing external genitalia and urethral meatus; normal appearing vaginal mucosa and cervix.  No abnormal  discharge noted.  Pap smear obtained.  Normal uterine size, no other palpable masses, no uterine or adnexal tenderness.  Performed in the presence of a chaperone.   Assessment and Plan:    1. General counseling and advice on female contraception 2. Oral contraception initial prescription Reviewed all forms of reversible birth control options available including  abstinence; fertility period awareness methods; over the counter/barrier methods; hormonal contraceptive medication including pill, patch, ring, injection,contraceptive implant; hormonal and nonhormonal IUDs.  Risks and benefits reviewed.  Questions were answered. She desired birth control pills, Loestrin 24Fe prescribed.  Will evaluate BP in 2 months; this should also help with her occasional irregular periods. Of note, she reports occasionally smoking cigars, is trying to cut down.  Was warned about increased VTE risk with smoking. - Norethindrone Acetate-Ethinyl Estrad-FE (LOESTRIN 24 FE) 1-20 MG-MCG(24) tablet; Take 1 tablet by mouth daily.  Dispense: 28 tablet; Refill: 11  3. Routine screening for STI (sexually transmitted infection) STI screen done, safe sex practices recommended during all sexual encounters. - Cytology - PAP( [facility]) - RPR+HBsAg+HCVAb+HIV  4. Well woman exam with routine gynecological exam - Cytology - PAP( [facility]) Will follow up results of pap smear and manage accordingly. Routine preventative health maintenance measures emphasized. Please refer to After Visit Summary for other counseling recommendations.      Jaynie Collins, MD, FACOG Obstetrician & Gynecologist, [facility] for [employer], [facility]

## 2021-03-11 LAB — RPR+HBSAG+HCVAB+...
HIV Screen 4th Generation wRfx: NONREACTIVE
Hep C Virus Ab: <0.1 s/co ratio (ref 0.0–0.9)
Hepatitis B Surface Ag: NEGATIVE
RPR Ser Ql: NONREACTIVE

## 2021-03-11 LAB — CYTOLOGY - PAP
Chlamydia: NEGATIVE
Comment: NEGATIVE
Comment: NORMAL
Diagnosis: NEGATIVE
Neisseria Gonorrhea: NEGATIVE

## 2021-04-28 ENCOUNTER — Ambulatory Visit: Payer: Medicaid Other | Admitting: Obstetrics & Gynecology

## 2021-05-13 ENCOUNTER — Encounter (HOSPITAL_COMMUNITY): Payer: Self-pay | Admitting: *Deleted

## 2021-05-13 ENCOUNTER — Emergency Department (HOSPITAL_COMMUNITY)
Admission: EM | Admit: 2021-05-13 | Discharge: 2021-05-13 | Disposition: A | Discharge status: Home / Self Care | Payer: Medicaid Other | Attending: Emergency Medicine | Admitting: Emergency Medicine

## 2021-05-13 ENCOUNTER — Other Ambulatory Visit: Payer: Self-pay

## 2021-05-13 DIAGNOSIS — N939 Abnormal uterine and vaginal bleeding, unspecified: Secondary | ICD-10-CM | POA: Insufficient documentation

## 2021-05-13 DIAGNOSIS — N9489 Other specified conditions associated with female genital organs and menstrual cycle: Secondary | ICD-10-CM | POA: Insufficient documentation

## 2021-05-13 DIAGNOSIS — R109 Unspecified abdominal pain: Secondary | ICD-10-CM | POA: Insufficient documentation

## 2021-05-13 DIAGNOSIS — Z5321 Procedure and treatment not carried out due to patient leaving prior to being seen by health care provider: Secondary | ICD-10-CM | POA: Insufficient documentation

## 2021-05-13 LAB — URINALYSIS, ROUTINE W REFLEX MICROSCOPIC
Bilirubin Urine: NEGATIVE
Glucose, UA: NEGATIVE mg/dL
Ketones, ur: NEGATIVE mg/dL
Nitrite: NEGATIVE
Protein, ur: 100 mg/dL — AB
Specific Gravity, Urine: 1.01 (ref 1.005–1.030)
pH: 8.5 — ABNORMAL HIGH (ref 5.0–8.0)

## 2021-05-13 LAB — BASIC METABOLIC PANEL
Anion gap: 7 (ref 5–15)
BUN: 10 mg/dL (ref 6–20)
CO2: 24 mmol/L (ref 22–32)
Calcium: 9.3 mg/dL (ref 8.9–10.3)
Chloride: 107 mmol/L (ref 98–111)
Creatinine, Ser: 0.93 mg/dL (ref 0.44–1.00)
GFR, Estimated: >60 mL/min (ref >60)
Glucose, Bld: 80 mg/dL (ref 70–99)
Potassium: 4.1 mmol/L (ref 3.5–5.1)
Sodium: 138 mmol/L (ref 135–145)

## 2021-05-13 LAB — URINALYSIS, MICROSCOPIC (REFLEX)

## 2021-05-13 LAB — CBC WITH DIFFERENTIAL/PLATELET
Abs Immature Granulocytes: 0.02 10*3/uL (ref 0.00–0.07)
Basophils Absolute: 0.1 10*3/uL (ref 0.0–0.1)
Basophils Relative: 1 %
Eosinophils Absolute: 0.4 10*3/uL (ref 0.0–0.5)
Eosinophils Relative: 4 %
HCT: 39.2 % (ref 36.0–46.0)
Hemoglobin: 12.6 g/dL (ref 12.0–15.0)
Immature Granulocytes: 0 %
Lymphocytes Relative: 43 %
Lymphs Abs: 4 10*3/uL (ref 0.7–4.0)
MCH: 27.9 pg (ref 26.0–34.0)
MCHC: 32.1 g/dL (ref 30.0–36.0)
MCV: 86.9 fL (ref 80.0–100.0)
Monocytes Absolute: 0.5 10*3/uL (ref 0.1–1.0)
Monocytes Relative: 5 %
Neutro Abs: 4.4 10*3/uL (ref 1.7–7.7)
Neutrophils Relative %: 47 %
Platelets: 295 10*3/uL (ref 150–400)
RBC: 4.51 MIL/uL (ref 3.87–5.11)
RDW: 15.2 % (ref 11.5–15.5)
WBC: 9.3 10*3/uL (ref 4.0–10.5)
nRBC: 0 % (ref 0.0–0.2)

## 2021-05-13 LAB — I-STAT BETA HCG BLOOD, ED (MC, WL, AP ONLY): I-stat hCG, quantitative: <5 m[IU]/mL (ref <5)

## 2021-05-13 NOTE — ED Triage Notes (Signed)
PT would like to find out if she is pregnant.  Has not had a chance to get a home pregnancy test.  The beginning of her LMP was August 29th and she states that she started spotting today.

## 2021-05-13 NOTE — ED Notes (Signed)
Pt decided to leave while waiting for you.

## 2021-05-13 NOTE — ED Provider Notes (Signed)
Emergency Medicine Provider Triage Evaluation Note  Dana Woods , a 20 y.o. female  was evaluated in triage.  Pt complains of abdominal cramps and vaginal bleeding.  She states that her symptoms started today.  She has not taken a pregnancy test.  She is due to come on her cycle.  Review of Systems  Positive: Abdominal cramps, vaginal bleeding Negative: Fever, chills, cough  Physical Exam  BP 119/70 (BP Location: Left Arm)   Pulse 98   Temp 98.4 F (36.9 C) (Oral)   Resp 14   Ht 5\' 6"  (1.676 m)   Wt 135.2 kg   LMP 04/19/2021   SpO2 100%   BMI 48.10 kg/m  Gen:   Awake, no distress   Resp:  Normal effort  MSK:   Moves extremities without difficulty  Other:    Medical Decision Making  Medically screening exam initiated at 2:19 AM.  Appropriate orders placed.  Dana Woods was informed that the remainder of the evaluation will be completed by another provider, this initial triage assessment does not replace that evaluation, and the importance of remaining in the ED until their evaluation is complete.  Vaginal bleeding   Dana Corwin, PA-C 05/13/21 0220    05/15/21, MD 05/14/21 [phone]

## 2021-07-07 ENCOUNTER — Emergency Department (HOSPITAL_COMMUNITY)
Admission: EM | Admit: 2021-07-07 | Discharge: 2021-07-07 | Disposition: A | Discharge status: Home / Self Care | Payer: Medicaid Other | Attending: Emergency Medicine | Admitting: Emergency Medicine

## 2021-07-07 ENCOUNTER — Emergency Department (HOSPITAL_COMMUNITY): Payer: Medicaid Other

## 2021-07-07 DIAGNOSIS — S60413A Abrasion of left middle finger, initial encounter: Secondary | ICD-10-CM | POA: Insufficient documentation

## 2021-07-07 DIAGNOSIS — R52 Pain, unspecified: Secondary | ICD-10-CM

## 2021-07-07 DIAGNOSIS — Z23 Encounter for immunization: Secondary | ICD-10-CM | POA: Insufficient documentation

## 2021-07-07 DIAGNOSIS — W25XXXA Contact with sharp glass, initial encounter: Secondary | ICD-10-CM | POA: Insufficient documentation

## 2021-07-07 DIAGNOSIS — F17290 Nicotine dependence, other tobacco product, uncomplicated: Secondary | ICD-10-CM | POA: Insufficient documentation

## 2021-07-07 DIAGNOSIS — J45909 Unspecified asthma, uncomplicated: Secondary | ICD-10-CM | POA: Insufficient documentation

## 2021-07-07 DIAGNOSIS — Z7951 Long term (current) use of inhaled steroids: Secondary | ICD-10-CM | POA: Insufficient documentation

## 2021-07-07 DIAGNOSIS — S60419A Abrasion of unspecified finger, initial encounter: Secondary | ICD-10-CM

## 2021-07-07 MED ORDER — TETANUS-DIPHTH-ACELL PERTUSSIS 5-2.5-18.5 LF-MCG/0.5 IM SUSY
0.5000 mL | PREFILLED_SYRINGE | Freq: Once | INTRAMUSCULAR | Status: AC
  Administered 2021-07-07: 0.5 mL via INTRAMUSCULAR
  Filled 2021-07-07: qty 0.5

## 2021-07-07 NOTE — ED Provider Notes (Signed)
[facility] EMERGENCY DEPARTMENT Provider Note   CSN: [id number] Arrival date & time: 07/07/21  1626     History Chief Complaint  Patient presents with   Finger Injury    Dana Woods is a 20 y.o. female.  The history is provided by the patient. No language interpreter was used.   20 year old female presenting for evaluation of a finger injury.  Patient reports 2 days ago she accidentally suffered a laceration to her left middle finger from a broken glass from a mirror.  States that she had to put some Neosporin and put a dressing on and it bleeds through it which concerns her.  Patient states she wants the wound to be closed.  She denies significant pain at this time.  She is unsure last tetanus status.  She is right-hand dominant.  She has no other complaint.  No numbness.  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   OB History     Gravida  0   Para  0   Term  0   Preterm  0   AB  0   Living  0      SAB  0   IAB  0   Ectopic  0   Multiple  0   Live Births  0           Family History  Problem Relation Age of Onset   ADD / ADHD Sister     Social History   Tobacco Use   Smoking status: Some Days    Types: Cigars   Smokeless tobacco: Never  Substance Use Topics   Alcohol use: Not Currently   Drug use: Never    Home Medications Prior to Admission  medications   Medication Sig Start Date End Date Taking? Authorizing Provider  cephALEXin (KEFLEX) 500 MG capsule Take 1 capsule (500 mg total) by mouth 2 (two) times daily. 07/13/19   Mardella Layman, MD  Norethindrone Acetate-Ethinyl Estrad-FE (LOESTRIN 24 FE) 1-20 MG-MCG(24) tablet Take 1 tablet by mouth daily. 03/10/21   Anyanwu, Jethro Bastos, MD  FLUoxetine (PROZAC) 20 MG capsule Take 1 capsule (20 mg total) by mouth daily. 02/08/19 07/13/19  Theadore Nan, MD  ipratropium (ATROVENT) 0.06 % nasal spray Place 2 sprays into both nostrils 3 (three) times daily. 11/02/17 07/13/19  Georgetta Haber, NP    Allergies    Patient has no known allergies.  Review of Systems   Review of Systems  Constitutional:  Negative for fever.  Skin:  Positive for wound.  Neurological:  Negative for numbness.   Physical Exam Updated Vital Signs BP 95/64 (BP Location: Right Arm)   Pulse 83   Temp 98.2 F (36.8 C) (Oral)   Resp 18   LMP 06/23/2021 (Approximate)   SpO2 96%   Physical Exam Vitals and  nursing note reviewed.  Constitutional:      General: She is not in acute distress.    Appearance: She is well-developed.  HENT:     Head: Atraumatic.  Eyes:     Conjunctiva/sclera: Conjunctivae normal.  Pulmonary:     Effort: Pulmonary effort is normal.  Musculoskeletal:        General: Signs of injury (Left middle finger: Small abrasion noted to the dorsum of PIP with minimal tenderness no foreign body noted.  Normal range of motion and brisk cap refill.) present.     Cervical back: Neck supple.  Skin:    Findings: No rash.  Neurological:     Mental Status: She is alert.  Psychiatric:        Mood and Affect: Mood normal.    ED Results / Procedures / Treatments   Labs (all labs ordered are listed, but only abnormal results are displayed) Labs Reviewed - No data to display  EKG None  Radiology DG Finger Middle Left  Result Date: 07/07/2021 CLINICAL DATA:  Laceration EXAM: LEFT MIDDLE  FINGER 2+V COMPARISON:  None. FINDINGS: No acute bony abnormality. Specifically, no fracture, subluxation, or dislocation. No radiopaque foreign bodies. No soft tissue gas. IMPRESSION: No fracture or foreign body. Electronically Signed   By: Charlett Nose M.D.   On: 07/07/2021 18:05    Procedures Procedures   Medications Ordered in ED Medications  Tdap (BOOSTRIX) injection 0.5 mL (has no administration in time range)    ED Course  I have reviewed the triage vital signs and the nursing notes.  Pertinent labs & imaging results that were available during my care of the patient were reviewed by me and considered in my medical decision making (see chart for details).    MDM Rules/Calculators/A&P                           BP 95/64 (BP Location: Right Arm)   Pulse 83   Temp 98.2 F (36.8 C) (Oral)   Resp 18   LMP 06/23/2021 (Approximate)   SpO2 96%   Final Clinical Impression(s) / ED Diagnoses Final diagnoses:  Abrasion of skin of finger of left hand    Rx / DC Orders ED Discharge Orders     None      8:07 PM Minor skin injury to L providers middle finger without the need for lac repair.  No fb noted, no evidence of infection. Recommend neosporin.  Wound care instruction given.  Will update tdap.     Fayrene Helper, PA-C 07/07/21 2010    Melene Plan, DO 07/08/21 [phone]

## 2021-07-07 NOTE — ED Triage Notes (Signed)
Pt w abrasion to L middle finger caused by glass from mirror. States bleeding off & on, wants cut closed. Not bleeding in triage, unsure of tetanus status. Neosporin & bandaids used.

## 2021-07-28 ENCOUNTER — Encounter (HOSPITAL_COMMUNITY): Payer: Self-pay

## 2021-07-28 ENCOUNTER — Emergency Department (HOSPITAL_COMMUNITY)
Admission: EM | Admit: 2021-07-28 | Discharge: 2021-07-29 | Disposition: A | Discharge status: Home / Self Care | Payer: Medicaid Other | Attending: Emergency Medicine | Admitting: Emergency Medicine

## 2021-07-28 ENCOUNTER — Emergency Department (HOSPITAL_COMMUNITY): Payer: Medicaid Other

## 2021-07-28 DIAGNOSIS — S5012XA Contusion of left forearm, initial encounter: Secondary | ICD-10-CM | POA: Insufficient documentation

## 2021-07-28 DIAGNOSIS — W228XXA Striking against or struck by other objects, initial encounter: Secondary | ICD-10-CM | POA: Insufficient documentation

## 2021-07-28 DIAGNOSIS — Z5321 Procedure and treatment not carried out due to patient leaving prior to being seen by health care provider: Secondary | ICD-10-CM | POA: Insufficient documentation

## 2021-07-28 NOTE — ED Provider Notes (Signed)
Emergency Medicine Provider Triage Evaluation Note  Dana Woods , a 20 y.o. female  was evaluated in triage.  Pt complains of left forearm pain after her friend shutting it in a door in the house last night.  She reports swelling, ecchymosis, pain.  She has no other complaints at this time.  Review of Systems  Positive: + arm pain, swelling, ecchymosis Negative: - weakness, numbness, tingling  Physical Exam  BP 119/83   Pulse (!) 108   Temp 98.7 F (37.1 C) (Oral)   Resp 18   Ht 5\' 6"  (1.676 m)   Wt (!) 140 kg   SpO2 98%   BMI 49.82 kg/m  Gen:   Awake, no distress   Resp:  Normal effort  MSK:   Moves extremities without difficulty  Other:  Left forearm dorsally with mild swelling, ecchymosis, tenderness palpation.  2+ radial pulse.  No tenderness to left elbow or left wrist  Medical Decision Making  Medically screening exam initiated at 8:10 PM.  Appropriate orders placed.  Dana Woods was informed that the remainder of the evaluation will be completed by another provider, this initial triage assessment does not replace that evaluation, and the importance of remaining in the ED until their evaluation is complete.     Dana Corwin, PA-C 07/28/21 2011    2012, MD 07/28/21 2138

## 2021-07-28 NOTE — ED Triage Notes (Signed)
Pt arrives POV for eval of L forearm pain. Pt reports friend shut it in door last night. Now reports subjective swelling and pain. +CSM to LUE

## 2021-07-29 NOTE — ED Notes (Signed)
X2 for vitals recheck with no response °

## 2021-09-11 ENCOUNTER — Other Ambulatory Visit: Payer: Self-pay

## 2021-09-11 ENCOUNTER — Encounter (HOSPITAL_COMMUNITY): Payer: Self-pay | Admitting: Emergency Medicine

## 2021-09-11 ENCOUNTER — Emergency Department (HOSPITAL_COMMUNITY)
Admission: EM | Admit: 2021-09-11 | Discharge: 2021-09-11 | Discharge status: Against Medical Advice | Payer: Medicaid Other | Attending: Physician Assistant | Admitting: Physician Assistant

## 2021-09-11 DIAGNOSIS — R1030 Lower abdominal pain, unspecified: Secondary | ICD-10-CM | POA: Insufficient documentation

## 2021-09-11 DIAGNOSIS — N6459 Other signs and symptoms in breast: Secondary | ICD-10-CM | POA: Insufficient documentation

## 2021-09-11 DIAGNOSIS — Z5321 Procedure and treatment not carried out due to patient leaving prior to being seen by health care provider: Secondary | ICD-10-CM | POA: Insufficient documentation

## 2021-09-11 DIAGNOSIS — M545 Low back pain, unspecified: Secondary | ICD-10-CM | POA: Insufficient documentation

## 2021-09-11 DIAGNOSIS — N898 Other specified noninflammatory disorders of vagina: Secondary | ICD-10-CM | POA: Insufficient documentation

## 2021-09-11 DIAGNOSIS — R82998 Other abnormal findings in urine: Secondary | ICD-10-CM | POA: Insufficient documentation

## 2021-09-11 LAB — URINALYSIS, ROUTINE W REFLEX MICROSCOPIC
Bilirubin Urine: NEGATIVE
Glucose, UA: NEGATIVE mg/dL
Hgb urine dipstick: NEGATIVE
Ketones, ur: NEGATIVE mg/dL
Nitrite: NEGATIVE
Protein, ur: NEGATIVE mg/dL
Specific Gravity, Urine: 1.027 (ref 1.005–1.030)
pH: 5 (ref 5.0–8.0)

## 2021-09-11 LAB — PREGNANCY, URINE: Preg Test, Ur: NEGATIVE

## 2021-09-11 NOTE — ED Notes (Signed)
Called for vitals x2 

## 2021-09-11 NOTE — ED Triage Notes (Signed)
Patient reports malodorous vaginal discharge with concentrated urine for several days , she added sore nipples and low abdominal pain radiating to lower back . No emesis or diarrhea .

## 2021-09-11 NOTE — ED Provider Triage Note (Signed)
Emergency Medicine Provider Triage Evaluation Note  Dana Woods , a 21 y.o. female  was evaluated in triage.  Pt complains of vaginal discharge.  Patient states that she began having mild vaginal discharge about 1 week ago.  Also notes an episode of right lower pelvic pain last week which resolved.  Now notes right low back pain.  Denies dysuria but notes urinary frequency as well as darker urine than normal.  States she is sexually active with 1 female partner and they do not use protection during sexual encounter 2 weeks ago.  States that she was post to start her menstrual period yesterday.  Physical Exam  BP 129/90 (BP Location: Right Arm)    Pulse 92    Temp 98.8 F (37.1 C) (Oral)    Resp 18    LMP 08/13/2021    SpO2 100%  Gen:   Awake, no distress   Resp:  Normal effort  MSK:   Moves extremities without difficulty  Other:    Medical Decision Making  Medically screening exam initiated at 4:02 AM.  Appropriate orders placed.  Dana Woods was informed that the remainder of the evaluation will be completed by another provider, this initial triage assessment does not replace that evaluation, and the importance of remaining in the ED until their evaluation is complete.   Placido Sou, PA-C 09/11/21 0403

## 2021-10-19 ENCOUNTER — Encounter (HOSPITAL_COMMUNITY): Payer: Self-pay

## 2021-10-19 ENCOUNTER — Emergency Department (HOSPITAL_COMMUNITY)
Admission: EM | Admit: 2021-10-19 | Discharge: 2021-10-19 | Disposition: A | Discharge status: Home / Self Care | Payer: Self-pay | Attending: Emergency Medicine | Admitting: Emergency Medicine

## 2021-10-19 ENCOUNTER — Other Ambulatory Visit: Payer: Self-pay

## 2021-10-19 ENCOUNTER — Emergency Department (HOSPITAL_COMMUNITY): Payer: Self-pay

## 2021-10-19 DIAGNOSIS — D649 Anemia, unspecified: Secondary | ICD-10-CM | POA: Insufficient documentation

## 2021-10-19 DIAGNOSIS — K8020 Calculus of gallbladder without cholecystitis without obstruction: Secondary | ICD-10-CM | POA: Insufficient documentation

## 2021-10-19 DIAGNOSIS — R1011 Right upper quadrant pain: Secondary | ICD-10-CM

## 2021-10-19 LAB — CBC
HCT: 36.3 % (ref 36.0–46.0)
Hemoglobin: 11.5 g/dL — ABNORMAL LOW (ref 12.0–15.0)
MCH: 27.8 pg (ref 26.0–34.0)
MCHC: 31.7 g/dL (ref 30.0–36.0)
MCV: 87.9 fL (ref 80.0–100.0)
Platelets: 288 10*3/uL (ref 150–400)
RBC: 4.13 MIL/uL (ref 3.87–5.11)
RDW: 14.6 % (ref 11.5–15.5)
WBC: 8 10*3/uL (ref 4.0–10.5)
nRBC: 0 % (ref 0.0–0.2)

## 2021-10-19 LAB — URINALYSIS, ROUTINE W REFLEX MICROSCOPIC
Bilirubin Urine: NEGATIVE
Glucose, UA: NEGATIVE mg/dL
Hgb urine dipstick: NEGATIVE
Ketones, ur: NEGATIVE mg/dL
Leukocytes,Ua: NEGATIVE
Nitrite: NEGATIVE
Protein, ur: NEGATIVE mg/dL
Specific Gravity, Urine: 1.011 (ref 1.005–1.030)
pH: 6 (ref 5.0–8.0)

## 2021-10-19 LAB — COMPREHENSIVE METABOLIC PANEL
ALT: 18 U/L (ref 0–44)
AST: 13 U/L — ABNORMAL LOW (ref 15–41)
Albumin: 3.4 g/dL — ABNORMAL LOW (ref 3.5–5.0)
Alkaline Phosphatase: 78 U/L (ref 38–126)
Anion gap: 4 — ABNORMAL LOW (ref 5–15)
BUN: 15 mg/dL (ref 6–20)
CO2: 24 mmol/L (ref 22–32)
Calcium: 8.6 mg/dL — ABNORMAL LOW (ref 8.9–10.3)
Chloride: 107 mmol/L (ref 98–111)
Creatinine, Ser: 0.97 mg/dL (ref 0.44–1.00)
GFR, Estimated: >60 mL/min (ref >60)
Glucose, Bld: 92 mg/dL (ref 70–99)
Potassium: 3.9 mmol/L (ref 3.5–5.1)
Sodium: 135 mmol/L (ref 135–145)
Total Bilirubin: 0.2 mg/dL — ABNORMAL LOW (ref 0.3–1.2)
Total Protein: 6.8 g/dL (ref 6.5–8.1)

## 2021-10-19 LAB — LIPASE, BLOOD: Lipase: 31 U/L (ref 11–51)

## 2021-10-19 LAB — DIFFERENTIAL
Abs Immature Granulocytes: 0.02 10*3/uL (ref 0.00–0.07)
Basophils Absolute: 0 10*3/uL (ref 0.0–0.1)
Basophils Relative: 1 %
Eosinophils Absolute: 0.3 10*3/uL (ref 0.0–0.5)
Eosinophils Relative: 4 %
Immature Granulocytes: 0 %
Lymphocytes Relative: 29 %
Lymphs Abs: 2.3 10*3/uL (ref 0.7–4.0)
Monocytes Absolute: 0.4 10*3/uL (ref 0.1–1.0)
Monocytes Relative: 5 %
Neutro Abs: 4.8 10*3/uL (ref 1.7–7.7)
Neutrophils Relative %: 61 %

## 2021-10-19 LAB — I-STAT BETA HCG BLOOD, ED (MC, WL, AP ONLY): I-stat hCG, quantitative: <5 m[IU]/mL (ref <5)

## 2021-10-19 MED ORDER — ONDANSETRON HCL 4 MG/2ML IJ SOLN
4.0000 mg | Freq: Once | INTRAMUSCULAR | Status: AC
Start: 2021-10-19 — End: 2021-10-19
  Administered 2021-10-19: 4 mg via INTRAVENOUS
  Filled 2021-10-19: qty 2

## 2021-10-19 MED ORDER — MORPHINE SULFATE (PF) 4 MG/ML IV SOLN
4.0000 mg | Freq: Once | INTRAVENOUS | Status: AC
  Administered 2021-10-19: 4 mg via INTRAVENOUS
  Filled 2021-10-19: qty 1

## 2021-10-19 NOTE — ED Triage Notes (Signed)
Patient c/o RUQ abdominal pain since. Patient denies N/v/D.

## 2021-10-19 NOTE — ED Provider Notes (Signed)
[facility]-EMERGENCY DEPT Provider Note   CSN: [id number] Arrival date & time: 10/19/21  5027     History  Chief Complaint  Patient presents with   Abdominal Pain    Dana Woods is a 21 y.o. female.  The history is provided by the patient and a parent.  Abdominal Pain  Patient is a 21 year old female presenting with right upper quadrant pain x1 day.  Started last night after dinner, worse in the middle of the night and woke her up.  Does not radiate elsewhere, feels like a sharp stabbing pain.  Is intermittent, 7 out of 10 currently in the room.  Unable to identify aggravating factors, denies any alleviating factors other than time thus far.  No history of similar, no prior abdominal surgeries.  Denies nausea, vomiting, diarrhea.  Home Medications Prior to Admission medications   Medication Sig Start Date End Date Taking? Authorizing Provider  cephALEXin (KEFLEX) 500 MG capsule Take 1 capsule (500 mg total) by mouth 2 (two) times daily. 07/13/19   Mardella Layman, MD  Norethindrone Acetate-Ethinyl Estrad-FE (LOESTRIN 24 FE) 1-20 MG-MCG(24) tablet Take 1 tablet by mouth daily. 03/10/21   Anyanwu, Jethro Bastos, MD  FLUoxetine (PROZAC) 20 MG capsule Take 1 capsule (20 mg total) by mouth daily. 02/08/19 07/13/19  Theadore Nan, MD  ipratropium (ATROVENT) 0.06 % nasal spray Place 2 sprays into both nostrils 3 (three) times daily. 11/02/17 07/13/19  Georgetta Haber, NP      Allergies    Patient has no known allergies.    Review of Systems   Review of Systems  Gastrointestinal:  Positive for abdominal pain.   Physical Exam Updated Vital Signs BP (!) 158/80    Pulse 67    Temp 98 F (36.7 C) (Oral)    Resp 18    Ht 5\' 6"  (1.676 m)    Wt (!) 146.4 kg    LMP 09/20/2021 (Approximate)    SpO2 100%    BMI 52.10 kg/m  Physical Exam Vitals and nursing note reviewed. Exam conducted with a chaperone present.  Constitutional:      Appearance: Normal appearance. She is  obese.  HENT:     Head: Normocephalic and atraumatic.  Eyes:     General: No scleral icterus.       Right eye: No discharge.        Left eye: No discharge.     Extraocular Movements: Extraocular movements intact.     Pupils: Pupils are equal, round, and reactive to light.  Cardiovascular:     Rate and Rhythm: Normal rate and regular rhythm.     Pulses: Normal pulses.     Heart sounds: Normal heart sounds. No murmur heard.   No friction rub. No gallop.  Pulmonary:     Effort: Pulmonary effort is normal. No respiratory distress.     Breath sounds: Normal breath sounds.  Abdominal:     General: Abdomen is flat. Bowel sounds are normal. There is no distension.     Palpations: Abdomen is soft.     Tenderness: There is abdominal tenderness in the right upper quadrant. There is no guarding.  Skin:    General: Skin is warm and dry.     Coloration: Skin is not jaundiced.  Neurological:     Mental Status: She is alert. Mental status is at baseline.     Coordination: Coordination normal.    ED Results / Procedures / Treatments   Labs (all labs ordered  are listed, but only abnormal results are displayed) Labs Reviewed  COMPREHENSIVE METABOLIC PANEL - Abnormal; Notable for the following components:      Result Value   Calcium 8.6 (*)    Albumin 3.4 (*)    AST 13 (*)    Total Bilirubin 0.2 (*)    Anion gap 4 (*)    All other components within normal limits  CBC - Abnormal; Notable for the following components:   Hemoglobin 11.5 (*)    All other components within normal limits  URINALYSIS, ROUTINE W REFLEX MICROSCOPIC - Abnormal; Notable for the following components:   Color, Urine STRAW (*)    All other components within normal limits  LIPASE, BLOOD  DIFFERENTIAL  I-STAT BETA HCG BLOOD, ED ([facility], [facility], [facility] ONLY)    EKG None  Radiology US Abdomen Limited RUQ (LIVER/GB)  Result Date: 10/19/2021 CLINICAL DATA:  Right upper quadrant pain. EXAM: ULTRASOUND ABDOMEN LIMITED RIGHT  UPPER QUADRANT COMPARISON:  07/03/2020 CT abdomen FINDINGS: Gallbladder: Cholelithiasis. No gallbladder wall thickening or pericholecystic fluid. Large calculus measures 16 mm. Positive sonographic Murphy sign. Common bile duct: Diameter: 4 mm Liver: No focal lesion identified. Within normal limits in parenchymal echogenicity. Portal vein is patent on color Doppler imaging with normal direction of blood flow towards the liver. Other: None. IMPRESSION: 1. Cholelithiasis without sonographic evidence of acute cholecystitis. Electronically Signed   By: Elige Ko M.D.   On: 10/19/2021 11:24    Procedures Procedures    Medications Ordered in ED Medications  ondansetron (ZOFRAN) injection 4 mg (4 mg Intravenous Given 10/19/21 1048)  morphine (PF) 4 MG/ML injection 4 mg (4 mg Intravenous Given 10/19/21 1049)    ED Course/ Medical Decision Making/ A&P                           Medical Decision Making Amount and/or Complexity of Data Reviewed Labs: ordered. Radiology: ordered.  Risk Prescription drug management.   This patient presents to the ED for concern of right upper quadrant pain, this involves an extensive number of treatment options, and is a complaint that carries with it a high risk of complications and morbidity.  The differential diagnosis includes cholecystitis, choledocholithiasis, ectopic pregnancy, gastritis, pancreatitis   Additional history obtained:   Independent historian: mother     Lab Tests:  I ordered, viewed, and personally interpreted labs.  The pertinent results include: Patient is not pregnant.  Urine unremarkable.  CMP does not show any gross electrolyte derangement, no AKI.  No significant LFT changes.  CBC without leukocytosis, stable/mild anemia 11.5.   Imaging Studies ordered:  I directly visualized the right upper quadrant ultrasound, which showed cholelithiasis without evidence of cholecystitis  I agree with the radiologist interpretation     ECG/Cardiac monitoring:    The patient was maintained on a cardiac monitor.  Visualized monitor strip which showed NSR with HR 67- per my interpretation.    Medicines ordered and prescription drug management:  I ordered medication including: Morphine and Zofran  I have reviewed the patients home medicines and have made adjustments as needed   Test Considered:  Considered CT, however ultimately pain is primarily right upper quadrant and do not feel risk of radiation is warranted based on the differential.  Reevaluation:  After the interventions noted above, I reevaluated the patient and found patient pain improved.    Problems addressed / ED Course: 21 year old with right upper quadrant pain.  Focal, does not  radiate elsewhere.  Abdomen ultimately soft, laboratory work-up and imaging as above.  Doubt ectopic given patient is not pregnant.  No evidence of choledocholithiasis or cystitis, cholelithiasis noted on ultrasound.  No evidence of pancreatitis.  Do not feel patient additional work-up, pain is improved.  Will discharge with information for general surgery follow-up as needed for possible Coley cystectomy if indicated.  Return precautions given, discharged in stable condition.      Disposition:   After consideration of the diagnostic results and the patients response to treatment, I feel that the patent would benefit from general surgery follow up .            Final Clinical Impression(s) / ED Diagnoses Final diagnoses:  RUQ abdominal pain  Calculus of gallbladder without cholecystitis without obstruction    Rx / DC Orders ED Discharge Orders     None         Theron Arista, PA-C 10/19/21 1540    Gloris Manchester, MD 10/20/21 [phone]

## 2021-10-19 NOTE — Discharge Instructions (Addendum)
You have cholelithiasis.  As we discussed, information is attached.  Take Tylenol which is needed for pain.

## 2021-12-16 ENCOUNTER — Encounter: Payer: Medicaid Other | Admitting: Radiology

## 2021-12-16 DIAGNOSIS — Z0289 Encounter for other administrative examinations: Secondary | ICD-10-CM

## 2022-01-18 ENCOUNTER — Encounter: Payer: Medicaid Other | Admitting: Obstetrics & Gynecology

## 2022-02-10 ENCOUNTER — Inpatient Hospital Stay
Admit: 2022-02-10 | Discharge: 2022-02-11 | Disposition: A | Discharge status: Home / Self Care | Attending: Emergency Medicine

## 2022-02-10 DIAGNOSIS — R519 Headache, unspecified: Secondary | ICD-10-CM

## 2022-02-10 NOTE — Discharge Instructions (Signed)
Please alternate ibuprofen and Tylenol.  You can take 600 of ibuprofen every 8 hours and 1 g of Tylenol every 8 hours.  If you alternate these he can take something every 4 hours.  Please do not do this for more than 3 to 4 days as it can hurt your kidneys and liver.

## 2022-02-10 NOTE — ED Provider Notes (Signed)
[facility] EMERGENCY DEPT  EMERGENCY DEPARTMENT ENCOUNTER      Pt Name: Sara Turner  MRN: [id number]  Birthdate 2000/12/25  Date of evaluation: 02/10/2022  Provider: Olin Hauser, MD    CHIEF COMPLAINT       Chief Complaint   Patient presents with    Illness     Ambulatory. C/o feeling sick and "weak" starting yesterday after work but getting worse this morning. C/o headache, nausea. Pt febrile in triage. Denies any known sick contacts. Denies cough/congestion.         HISTORY OF PRESENT ILLNESS    Sara Turner is a 21 y.o. female presents with headache.  Patient reports she is feeling sick and weak starting yesterday but got worse today.  Patient also notes some nausea.  Patient denies any cough no chills no abdominal pain no urinary symptoms no chest pain or shortness of breath      Nursing Notes were reviewed.    REVIEW OF SYSTEMS       Review of Systems   Constitutional:  Negative for activity change, appetite change, chills and fever.   HENT:  Negative for congestion, ear pain and rhinorrhea.    Eyes:  Negative for pain and redness.   Respiratory:  Negative for cough, chest tightness, shortness of breath and wheezing.    Cardiovascular:  Negative for chest pain and leg swelling.   Gastrointestinal:  Negative for abdominal pain, diarrhea, nausea and vomiting.   Genitourinary:  Negative for dysuria and hematuria.   Musculoskeletal:  Negative for neck pain and neck stiffness.   Skin:  Negative for color change and rash.   Allergic/Immunologic: Negative for immunocompromised state.   Neurological:  Positive for headaches. Negative for seizures and numbness.   Hematological:  Negative for adenopathy.     Except as noted above the remainder of the review of systems was reviewed and negative.       PAST MEDICAL HISTORY   No past medical history on file.    SURGICAL HISTORY     No past surgical history on file.    CURRENT MEDICATIONS       Previous Medications    No medications on file       ALLERGIES     Patient has no known  allergies.    FAMILY HISTORY     No family history on file.     SOCIAL HISTORY       Social History     Socioeconomic History    Marital status: Single       SCREENINGS         Glasgow Coma Scale  Eye Opening: Spontaneous  Best Verbal Response: Oriented  Best Motor Response: Obeys commands  Glasgow Coma Scale Score: 15                     CIWA Assessment  BP: 107/63  Pulse: (!) 122                 PHYSICAL EXAM    (up to 7 for level 4, 8 or more for level 5)     ED Triage Vitals   BP Temp Temp src Pulse Respirations SpO2 Height Weight - Scale   02/10/22 1952 02/10/22 1950 -- 02/10/22 1950 02/10/22 1950 02/10/22 1950 02/10/22 1953 02/10/22 1950   107/63 (!) 103.2 F (39.6 C)  (!) 122 20 97 % 5\' 6"  (1.676 m) (!) 315 lb 7.7 oz (143.1 kg)  Physical Exam  Vitals and nursing note reviewed.   Constitutional:       General: She is not in acute distress.     Appearance: Normal appearance. She is normal weight. She is not toxic-appearing.   HENT:      Head: Normocephalic and atraumatic.      Right Ear: External ear normal.      Left Ear: External ear normal.      Nose: Nose normal.   Eyes:      Extraocular Movements: Extraocular movements intact.      Pupils: Pupils are equal, round, and reactive to light.   Pulmonary:      Effort: Pulmonary effort is normal. No respiratory distress.   Abdominal:      General: Abdomen is flat. There is no distension.   Musculoskeletal:         General: Normal range of motion.      Cervical back: Normal range of motion.   Neurological:      General: No focal deficit present.      Mental Status: She is alert and oriented to person, place, and time. Mental status is at baseline.      Motor: No weakness.   Psychiatric:         Mood and Affect: Mood normal.         Behavior: Behavior normal.         Thought Content: Thought content normal.         Judgment: Judgment normal.       DIAGNOSTIC RESULTS       PROCEDURES:  Unless otherwise noted below, none     Procedures    EKG: All EKG's are  interpreted by the Emergency Department Physician who either signs or Co-signs this chart in the absence of a cardiologist.    LABS:  Labs Reviewed   COVID-19 & INFLUENZA COMBO [facility])    Narrative:     Is this test for diagnosis or screening?->Diagnosis of ill patient  Symptomatic for COVID-19 as defined by CDC?->Yes  Date of Symptom Onset->02/09/22  Hospitalized for COVID-19?->No  Admitted to ICU for COVID-19?->No   COMPREHENSIVE METABOLIC PANEL   CBC WITH AUTO DIFFERENTIAL   URINALYSIS W/ RFLX MICROSCOPIC   PREGNANCY, URINE       All other labs were within normal range or not returned as of this dictation.    RADIOLOGY:   Non-plain film images such as CT, Ultrasound and MRI are read by the radiologist. Plain radiographic images are visualized and preliminarily interpreted by the emergency physician with the below findings:    Interpretation per the Radiologist below, if available at the time of this note:    POCUS    (Results Pending)         EMERGENCY DEPARTMENT COURSE/REASSESSMENT and MDM:   Vitals:    Vitals:    02/10/22 1950 02/10/22 1952 02/10/22 1953   BP:  107/63    Pulse: (!) 122     Resp: 20     Temp: (!) 103.2 F (39.6 C)     SpO2: 97%     Weight: (!) 143.1 kg     Height:   5\' 6"  (1.676 m)         Medical Decision Making  Amount and/or Complexity of Data Reviewed  Labs: ordered. Decision-making details documented in ED Course.    Risk  OTC drugs.  Prescription drug management.        ED Course:  ED Course as of 02/11/22 0020   Thu Feb 10, 2022   7062 21 year old female who presents with fever and headache.    Patient arrives emergency department tachycardic to 122 febrile with a 103.2 otherwise stable vital signs.  On exam patient is nontoxic-appearing in no acute distress.  Patient with normal neurologic exam, no focal neurodeficits.  Rest of exam reassuring.  Patient without any nuchal rigidity no meningismus.  Work-up so far negative COVID and flu, UA with small leukocyte Estrace, many  bacteria 11-20 whites, CMP pending, negative pregnancy, grossly normal CBC.  Patient given 15 mg IV Toradol.   [HB]   Fri Feb 11, 2022   0020 Lactic 0.7, cultures pending.  Patient reports resolution of her headache and is feeling better.  Patient now afebrile and not tachycardic.  No indication for admission or further work-up at this time patient given Rx for Keflex 3 times daily for 5 days for UTI.  Patient given instructions on supportive care at home as well as strict return precautions.  Patient discharged in stable condition. [HB]      ED Course User Index  [HB] Olin Hauser, MD         CONSULTS:  None    FINAL IMPRESSION    No diagnosis found.      DISPOSITION/PLAN   DISPOSITION        PATIENT REFERRED TO:  No follow-up provider specified.    DISCHARGE MEDICATIONS:  New Prescriptions    No medications on file       Controlled Substances Monitoring:     No flowsheet data found.    (Please note that portions of this note were completed with a voice recognition program.  Efforts were made to edit the dictations but occasionally words are mis-transcribed.)    Olin Hauser, MD (electronically signed)  Attending Emergency Physician            Olin Hauser, MD  02/11/22 [phone]

## 2022-02-11 LAB — COMPREHENSIVE METABOLIC PANEL
ALT: 16 U/L (ref 0–35)
AST: 21 U/L (ref 0–35)
Albumin/Globulin Ratio: 1 (ref 1.00–2.70)
Albumin: 3.9 g/dL (ref 3.5–5.2)
Alk Phosphatase: 96 U/L (ref 35–117)
Anion Gap: 9 mmol/L (ref 2–17)
BUN: 9 mg/dL (ref 6–20)
CO2: 23 mmol/L (ref 22–29)
Calcium: 8.8 mg/dL (ref 8.6–10.0)
Chloride: 102 mmol/L (ref 98–107)
Creatinine: 1.1 mg/dL — ABNORMAL HIGH (ref 0.5–1.0)
Est, Glom Filt Rate: 72 mL/min/1.73m (ref >=60)
Globulin: 3.1 g/dL (ref 1.9–4.4)
Glucose: 97 mg/dL (ref 70–99)
OSMOLALITY CALCULATED: 267 mOsm/kg — ABNORMAL LOW (ref 270–287)
Potassium: 3.6 mmol/L (ref 3.5–5.3)
Sodium: 134 mmol/L — ABNORMAL LOW (ref 135–145)
Total Bilirubin: 0.68 mg/dL (ref 0.00–1.20)
Total Protein: 7 g/dL (ref 6.4–8.3)

## 2022-02-11 LAB — MICROSCOPIC URINALYSIS
Amorphous, UA: NONE SEEN /HPF
MUCUS, URINE: NONE SEEN /LPF

## 2022-02-11 LAB — CBC WITH AUTO DIFFERENTIAL
Absolute Baso #: 0 10*3/uL (ref 0.0–0.2)
Absolute Eos #: 0 10*3/uL (ref 0.0–0.5)
Absolute Lymph #: 2 10*3/uL (ref 1.0–3.2)
Absolute Mono #: 0.7 10*3/uL (ref 0.3–1.0)
Basophils %: 0.4 % (ref 0.0–2.0)
Eosinophils %: 0.1 % (ref 0.0–7.0)
Hematocrit: 38 % (ref 34.0–47.0)
Hemoglobin: 12.3 g/dL (ref 11.5–15.7)
Immature Grans (Abs): 0.02 10*3/uL (ref 0.00–0.06)
Immature Granulocytes: 0.2 % (ref 0.0–0.6)
Lymphocytes: 18.6 % (ref 15.0–45.0)
MCH: 28.2 pg (ref 27.0–34.5)
MCHC: 32.4 g/dL (ref 32.0–36.0)
MCV: 87.2 fL (ref 81.0–99.0)
MPV: 9 fL (ref 7.2–13.2)
Monocytes: 6.3 % (ref 4.0–12.0)
Neutrophils %: 74.4 % — ABNORMAL HIGH (ref 42.0–74.0)
Neutrophils Absolute: 7.9 10*3/uL — ABNORMAL HIGH (ref 1.6–7.3)
Platelets: 243 10*3/uL (ref 140–440)
RBC: 4.36 x10e6/mcL (ref 3.60–5.20)
RDW: 14.4 % (ref 11.0–16.0)
WBC: 10.6 10*3/uL (ref 3.8–10.6)

## 2022-02-11 LAB — URINALYSIS W/ RFLX MICROSCOPIC
Bilirubin Urine: NEGATIVE
Glucose, UA: NEGATIVE
Nitrite, Urine: NEGATIVE
Specific Gravity, UA: 1.01 (ref 1.003–1.035)
Urobilinogen, Urine: 2 EU/dL
pH, UA: 7 (ref 4.5–8.0)

## 2022-02-11 LAB — LACTIC ACID: Lactic Acid: 0.7 mmol/L (ref 0.5–2.0)

## 2022-02-11 LAB — PREGNANCY, URINE: Pregnancy, Urine: NEGATIVE

## 2022-02-11 LAB — COVID-19 & INFLUENZA COMBO (LIAT HOSPITAL)
INFLUENZA A: NOT DETECTED
INFLUENZA B: NOT DETECTED
SARS-CoV-2: NOT DETECTED

## 2022-02-11 MED ORDER — ACETAMINOPHEN 500 MG PO TABS
500 MG | Freq: Once | ORAL | Status: AC
Start: 2022-02-11 — End: 2022-02-10
  Administered 2022-02-11: 03:00:00 1000 mg via ORAL

## 2022-02-11 MED ORDER — CEPHALEXIN 500 MG PO CAPS
500 MG | ORAL_CAPSULE | Freq: Three times a day (TID) | ORAL | 0 refills | Status: AC
Start: 2022-02-11 — End: 2022-02-15

## 2022-02-11 MED ORDER — SODIUM CHLORIDE 0.9 % IV BOLUS
0.9 % | Freq: Once | INTRAVENOUS | Status: AC
Start: 2022-02-11 — End: 2022-02-11
  Administered 2022-02-11: 03:00:00 1000 mL via INTRAVENOUS

## 2022-02-11 MED ORDER — KETOROLAC TROMETHAMINE 15 MG/ML IJ SOLN
15 MG/ML | Freq: Once | INTRAMUSCULAR | Status: AC
Start: 2022-02-11 — End: 2022-02-10
  Administered 2022-02-11: 02:00:00 15 mg via INTRAVENOUS

## 2022-02-11 MED FILL — ACETAMINOPHEN EXTRA STRENGTH 500 MG PO TABS: 500 MG | ORAL | Qty: 2

## 2022-02-11 MED FILL — KETOROLAC TROMETHAMINE 15 MG/ML IJ SOLN: 15 MG/ML | INTRAMUSCULAR | Qty: 1

## 2022-02-11 NOTE — Other (Signed)
Positive urine, sensitive to Keflex (cefazolin) which pt was prescribed.

## 2022-02-12 LAB — CULTURE, BLOOD 1

## 2022-02-13 LAB — CULTURE, URINE: Amended report:: >100000 — AB

## 2022-02-16 LAB — CULTURE, BLOOD 1

## 2022-02-16 LAB — ADD ON LAB TEST

## 2022-10-13 DIAGNOSIS — K5900 Constipation, unspecified: Secondary | ICD-10-CM | POA: Diagnosis not present

## 2022-10-13 DIAGNOSIS — K8020 Calculus of gallbladder without cholecystitis without obstruction: Secondary | ICD-10-CM | POA: Diagnosis not present

## 2022-10-18 ENCOUNTER — Other Ambulatory Visit: Payer: Self-pay

## 2022-10-18 ENCOUNTER — Ambulatory Visit (HOSPITAL_COMMUNITY)
Admission: EM | Admit: 2022-10-18 | Discharge: 2022-10-18 | Disposition: A | Discharge status: Home / Self Care | Payer: Medicaid Other | Attending: Emergency Medicine | Admitting: Emergency Medicine

## 2022-10-18 ENCOUNTER — Emergency Department (HOSPITAL_COMMUNITY): Payer: Medicaid Other | Admitting: Certified Registered Nurse Anesthetist

## 2022-10-18 ENCOUNTER — Encounter (HOSPITAL_COMMUNITY): Payer: Self-pay

## 2022-10-18 ENCOUNTER — Emergency Department (HOSPITAL_COMMUNITY): Payer: Medicaid Other

## 2022-10-18 ENCOUNTER — Emergency Department (HOSPITAL_BASED_OUTPATIENT_CLINIC_OR_DEPARTMENT_OTHER): Payer: Medicaid Other | Admitting: Certified Registered Nurse Anesthetist

## 2022-10-18 ENCOUNTER — Encounter (HOSPITAL_COMMUNITY)
Admission: EM | Disposition: A | Discharge status: Home / Self Care | Payer: Self-pay | Source: Home / Self Care | Attending: Emergency Medicine

## 2022-10-18 DIAGNOSIS — K8010 Calculus of gallbladder with chronic cholecystitis without obstruction: Secondary | ICD-10-CM | POA: Diagnosis not present

## 2022-10-18 DIAGNOSIS — Z6841 Body Mass Index (BMI) 40.0 and over, adult: Secondary | ICD-10-CM | POA: Insufficient documentation

## 2022-10-18 DIAGNOSIS — F17200 Nicotine dependence, unspecified, uncomplicated: Secondary | ICD-10-CM | POA: Diagnosis not present

## 2022-10-18 DIAGNOSIS — K8018 Calculus of gallbladder with other cholecystitis without obstruction: Secondary | ICD-10-CM | POA: Diagnosis not present

## 2022-10-18 DIAGNOSIS — F32A Depression, unspecified: Secondary | ICD-10-CM | POA: Insufficient documentation

## 2022-10-18 DIAGNOSIS — J45909 Unspecified asthma, uncomplicated: Secondary | ICD-10-CM | POA: Diagnosis not present

## 2022-10-18 DIAGNOSIS — F17210 Nicotine dependence, cigarettes, uncomplicated: Secondary | ICD-10-CM | POA: Insufficient documentation

## 2022-10-18 DIAGNOSIS — K8012 Calculus of gallbladder with acute and chronic cholecystitis without obstruction: Secondary | ICD-10-CM | POA: Diagnosis not present

## 2022-10-18 DIAGNOSIS — K810 Acute cholecystitis: Secondary | ICD-10-CM

## 2022-10-18 DIAGNOSIS — F17290 Nicotine dependence, other tobacco product, uncomplicated: Secondary | ICD-10-CM

## 2022-10-18 DIAGNOSIS — K8000 Calculus of gallbladder with acute cholecystitis without obstruction: Secondary | ICD-10-CM | POA: Diagnosis not present

## 2022-10-18 HISTORY — PX: CHOLECYSTECTOMY: SHX55

## 2022-10-18 LAB — COMPREHENSIVE METABOLIC PANEL
ALT: 13 U/L (ref 0–44)
AST: 14 U/L — ABNORMAL LOW (ref 15–41)
Albumin: 3.4 g/dL — ABNORMAL LOW (ref 3.5–5.0)
Alkaline Phosphatase: 71 U/L (ref 38–126)
Anion gap: 5 (ref 5–15)
BUN: 18 mg/dL (ref 6–20)
CO2: 23 mmol/L (ref 22–32)
Calcium: 8.6 mg/dL — ABNORMAL LOW (ref 8.9–10.3)
Chloride: 110 mmol/L (ref 98–111)
Creatinine, Ser: 0.99 mg/dL (ref 0.44–1.00)
GFR, Estimated: >60 mL/min (ref >60)
Glucose, Bld: 90 mg/dL (ref 70–99)
Potassium: 4.1 mmol/L (ref 3.5–5.1)
Sodium: 138 mmol/L (ref 135–145)
Total Bilirubin: 0.7 mg/dL (ref 0.3–1.2)
Total Protein: 6.6 g/dL (ref 6.5–8.1)

## 2022-10-18 LAB — CBC WITH DIFFERENTIAL/PLATELET
Abs Immature Granulocytes: 0.04 10*3/uL (ref 0.00–0.07)
Basophils Absolute: 0.1 10*3/uL (ref 0.0–0.1)
Basophils Relative: 1 %
Eosinophils Absolute: 0.2 10*3/uL (ref 0.0–0.5)
Eosinophils Relative: 2 %
HCT: 34.6 % — ABNORMAL LOW (ref 36.0–46.0)
Hemoglobin: 11.1 g/dL — ABNORMAL LOW (ref 12.0–15.0)
Immature Granulocytes: 1 %
Lymphocytes Relative: 26 %
Lymphs Abs: 2 10*3/uL (ref 0.7–4.0)
MCH: 28.4 pg (ref 26.0–34.0)
MCHC: 32.1 g/dL (ref 30.0–36.0)
MCV: 88.5 fL (ref 80.0–100.0)
Monocytes Absolute: 0.4 10*3/uL (ref 0.1–1.0)
Monocytes Relative: 5 %
Neutro Abs: 5.1 10*3/uL (ref 1.7–7.7)
Neutrophils Relative %: 65 %
Platelets: 284 10*3/uL (ref 150–400)
RBC: 3.91 MIL/uL (ref 3.87–5.11)
RDW: 14.6 % (ref 11.5–15.5)
WBC: 7.7 10*3/uL (ref 4.0–10.5)
nRBC: 0 % (ref 0.0–0.2)

## 2022-10-18 LAB — LIPASE, BLOOD: Lipase: 31 U/L (ref 11–51)

## 2022-10-18 LAB — I-STAT BETA HCG BLOOD, ED (MC, WL, AP ONLY): I-stat hCG, quantitative: <5 m[IU]/mL (ref <5)

## 2022-10-18 SURGERY — LAPAROSCOPIC CHOLECYSTECTOMY WITH INTRAOPERATIVE CHOLANGIOGRAM
Anesthesia: General | Laterality: Bilateral

## 2022-10-18 SURGERY — LAPAROSCOPIC CHOLECYSTECTOMY
Anesthesia: General

## 2022-10-18 MED ORDER — MORPHINE SULFATE (PF) 2 MG/ML IV SOLN: 2.0000 mg | INTRAVENOUS | Status: DC | PRN

## 2022-10-18 MED ORDER — ROCURONIUM BROMIDE 10 MG/ML (PF) SYRINGE
PREFILLED_SYRINGE | INTRAVENOUS | Status: AC
  Filled 2022-10-18: qty 10

## 2022-10-18 MED ORDER — OXYCODONE HCL 5 MG PO TABS
5.0000 mg | ORAL_TABLET | Freq: Once | ORAL | Status: AC
  Administered 2022-10-18: 5 mg via ORAL

## 2022-10-18 MED ORDER — HYDROMORPHONE HCL 1 MG/ML IJ SOLN
0.5000 mg | Freq: Once | INTRAMUSCULAR | Status: AC
  Administered 2022-10-18: 0.5 mg via INTRAVENOUS
  Filled 2022-10-18: qty 1

## 2022-10-18 MED ORDER — ONDANSETRON HCL 4 MG/2ML IJ SOLN: 4.0000 mg | Freq: Four times a day (QID) | INTRAMUSCULAR | Status: DC | PRN

## 2022-10-18 MED ORDER — LACTATED RINGERS IR SOLN
Status: DC | PRN
  Administered 2022-10-18: 1000 mL

## 2022-10-18 MED ORDER — FENTANYL CITRATE (PF) 250 MCG/5ML IJ SOLN
INTRAMUSCULAR | Status: AC
  Filled 2022-10-18: qty 5

## 2022-10-18 MED ORDER — ACETAMINOPHEN 500 MG PO TABS: 1000.0000 mg | ORAL_TABLET | Freq: Four times a day (QID) | ORAL | Status: DC | PRN

## 2022-10-18 MED ORDER — POLYETHYLENE GLYCOL 3350 17 G PO PACK: 17.0000 g | PACK | Freq: Every day | ORAL | 0 refills | Status: DC

## 2022-10-18 MED ORDER — SODIUM CHLORIDE 0.9 % IV BOLUS
1000.0000 mL | Freq: Once | INTRAVENOUS | Status: AC
  Administered 2022-10-18: 1000 mL via INTRAVENOUS

## 2022-10-18 MED ORDER — 0.9 % SODIUM CHLORIDE (POUR BTL) OPTIME
TOPICAL | Status: DC | PRN
  Administered 2022-10-18: 1000 mL

## 2022-10-18 MED ORDER — SODIUM CHLORIDE 0.9 % IV SOLN
2.0000 g | Freq: Once | INTRAVENOUS | Status: AC
  Administered 2022-10-18: 2 g via INTRAVENOUS
  Filled 2022-10-18: qty 20

## 2022-10-18 MED ORDER — ACETAMINOPHEN 500 MG PO TABS
1000.0000 mg | ORAL_TABLET | Freq: Once | ORAL | Status: AC
  Administered 2022-10-18: 1000 mg via ORAL
  Filled 2022-10-18: qty 2

## 2022-10-18 MED ORDER — ONDANSETRON HCL 4 MG/2ML IJ SOLN
INTRAMUSCULAR | Status: AC
  Filled 2022-10-18: qty 2

## 2022-10-18 MED ORDER — DEXAMETHASONE SODIUM PHOSPHATE 4 MG/ML IJ SOLN
INTRAMUSCULAR | Status: DC | PRN
  Administered 2022-10-18: 6 mg via INTRAVENOUS

## 2022-10-18 MED ORDER — ONDANSETRON HCL 4 MG/2ML IJ SOLN
INTRAMUSCULAR | Status: DC | PRN
  Administered 2022-10-18: 4 mg via INTRAVENOUS

## 2022-10-18 MED ORDER — TRAMADOL HCL 50 MG PO TABS: 50.0000 mg | ORAL_TABLET | Freq: Four times a day (QID) | ORAL | Status: DC | PRN

## 2022-10-18 MED ORDER — KETOROLAC TROMETHAMINE 30 MG/ML IJ SOLN
INTRAMUSCULAR | Status: DC | PRN
  Administered 2022-10-18: 30 mg via INTRAVENOUS

## 2022-10-18 MED ORDER — PROPOFOL 500 MG/50ML IV EMUL
INTRAVENOUS | Status: AC
  Filled 2022-10-18: qty 50

## 2022-10-18 MED ORDER — LIDOCAINE HCL (PF) 2 % IJ SOLN
INTRAMUSCULAR | Status: AC
  Filled 2022-10-18: qty 5

## 2022-10-18 MED ORDER — BUPIVACAINE HCL (PF) 0.25 % IJ SOLN
INTRAMUSCULAR | Status: DC | PRN
  Administered 2022-10-18: 30 mL

## 2022-10-18 MED ORDER — ONDANSETRON HCL 4 MG/2ML IJ SOLN
4.0000 mg | Freq: Once | INTRAMUSCULAR | Status: AC
  Administered 2022-10-18: 4 mg via INTRAVENOUS
  Filled 2022-10-18: qty 2

## 2022-10-18 MED ORDER — FENTANYL CITRATE (PF) 100 MCG/2ML IJ SOLN
INTRAMUSCULAR | Status: DC | PRN
  Administered 2022-10-18 (×5): 50 ug via INTRAVENOUS

## 2022-10-18 MED ORDER — CHLORHEXIDINE GLUCONATE 0.12% ORAL RINSE (MEDLINE KIT): 15.0000 mL | Freq: Once | OROMUCOSAL | Status: DC

## 2022-10-18 MED ORDER — BUPIVACAINE HCL (PF) 0.25 % IJ SOLN
INTRAMUSCULAR | Status: AC
  Filled 2022-10-18: qty 30

## 2022-10-18 MED ORDER — OXYCODONE HCL 5 MG PO TABS: 5.0000 mg | ORAL_TABLET | Freq: Four times a day (QID) | ORAL | 0 refills | Status: DC | PRN

## 2022-10-18 MED ORDER — MIDAZOLAM HCL 5 MG/5ML IJ SOLN
INTRAMUSCULAR | Status: DC | PRN
  Administered 2022-10-18: 2 mg via INTRAVENOUS

## 2022-10-18 MED ORDER — DEXMEDETOMIDINE HCL IN NACL 80 MCG/20ML IV SOLN
INTRAVENOUS | Status: DC | PRN
  Administered 2022-10-18: 20 ug via BUCCAL

## 2022-10-18 MED ORDER — LIDOCAINE 2% (20 MG/ML) 5 ML SYRINGE
INTRAMUSCULAR | Status: DC | PRN
  Administered 2022-10-18: 100 mg via INTRAVENOUS

## 2022-10-18 MED ORDER — SUGAMMADEX SODIUM 200 MG/2ML IV SOLN
INTRAVENOUS | Status: DC | PRN
  Administered 2022-10-18: 400 mg via INTRAVENOUS

## 2022-10-18 MED ORDER — OXYCODONE HCL 5 MG PO TABS
ORAL_TABLET | ORAL | Status: AC
  Filled 2022-10-18: qty 1

## 2022-10-18 MED ORDER — INDOCYANINE GREEN 25 MG IV SOLR
2.5000 mg | INTRAVENOUS | Status: AC
  Administered 2022-10-18: 2.5 mg via INTRAVENOUS

## 2022-10-18 MED ORDER — ROCURONIUM BROMIDE 10 MG/ML (PF) SYRINGE
PREFILLED_SYRINGE | INTRAVENOUS | Status: DC | PRN
  Administered 2022-10-18: 70 mg via INTRAVENOUS

## 2022-10-18 MED ORDER — PROPOFOL 10 MG/ML IV BOLUS
INTRAVENOUS | Status: DC | PRN
  Administered 2022-10-18: 200 mg via INTRAVENOUS

## 2022-10-18 MED ORDER — FENTANYL CITRATE PF 50 MCG/ML IJ SOSY
PREFILLED_SYRINGE | INTRAMUSCULAR | Status: AC
  Administered 2022-10-18: 50 ug via INTRAVENOUS
  Filled 2022-10-18: qty 2

## 2022-10-18 MED ORDER — SODIUM CHLORIDE 0.9 % IV SOLN: INTRAVENOUS | Status: DC

## 2022-10-18 MED ORDER — FENTANYL CITRATE PF 50 MCG/ML IJ SOSY
25.0000 ug | PREFILLED_SYRINGE | INTRAMUSCULAR | Status: DC | PRN
  Administered 2022-10-18: 50 ug via INTRAVENOUS

## 2022-10-18 MED ORDER — LACTATED RINGERS IV SOLN: INTRAVENOUS | Status: DC

## 2022-10-18 MED ORDER — MIDAZOLAM HCL 2 MG/2ML IJ SOLN
INTRAMUSCULAR | Status: AC
  Filled 2022-10-18: qty 2

## 2022-10-18 SURGICAL SUPPLY — 42 items
APPLIER CLIP 5 13 M/L LIGAMAX5 (MISCELLANEOUS) ×1
BAG COUNTER SPONGE SURGICOUNT (BAG) IMPLANT
CHLORAPREP W/TINT 26 (MISCELLANEOUS) ×1 IMPLANT
CLIP APPLIE 5 13 M/L LIGAMAX5 (MISCELLANEOUS) ×1 IMPLANT
COVER MAYO STAND XLG (MISCELLANEOUS) ×1 IMPLANT
COVER SURGICAL LIGHT HANDLE (MISCELLANEOUS) ×1 IMPLANT
DERMABOND ADVANCED .7 DNX12 (GAUZE/BANDAGES/DRESSINGS) ×1 IMPLANT
DRAPE C-ARM 42X120 X-RAY (DRAPES) IMPLANT
ELECT L-HOOK LAP 45CM DISP (ELECTROSURGICAL)
ELECT PENCIL ROCKER SW 15FT (MISCELLANEOUS) ×1 IMPLANT
ELECT REM PT RETURN 15FT ADLT (MISCELLANEOUS) ×1 IMPLANT
ELECTRODE L-HOOK LAP 45CM DISP (ELECTROSURGICAL) IMPLANT
GLOVE BIOGEL PI IND STRL 6 (GLOVE) ×1 IMPLANT
GLOVE BIOGEL PI MICRO STRL 5.5 (GLOVE) ×1 IMPLANT
GOWN STRL REUS W/ TWL LRG LVL3 (GOWN DISPOSABLE) ×1 IMPLANT
GOWN STRL REUS W/TWL LRG LVL3 (GOWN DISPOSABLE) ×1
GRASPER SUT TROCAR 14GX15 (MISCELLANEOUS) IMPLANT
HEMOSTAT SNOW SURGICEL 2X4 (HEMOSTASIS) IMPLANT
IRRIG SUCT STRYKERFLOW 2 WTIP (MISCELLANEOUS) ×1
IRRIGATION SUCT STRKRFLW 2 WTP (MISCELLANEOUS) ×1 IMPLANT
KIT BASIN OR (CUSTOM PROCEDURE TRAY) ×1 IMPLANT
KIT TURNOVER KIT A (KITS) IMPLANT
L-HOOK LAP DISP 36CM (ELECTROSURGICAL) ×1
LHOOK LAP DISP 36CM (ELECTROSURGICAL) ×1 IMPLANT
NDL INSUFFLATION 14GA 120MM (NEEDLE) IMPLANT
NEEDLE INSUFFLATION 14GA 120MM (NEEDLE) IMPLANT
POUCH RETRIEVAL ECOSAC 10 (ENDOMECHANICALS) IMPLANT
POUCH RETRIEVAL ECOSAC 10MM (ENDOMECHANICALS)
SCISSORS LAP 5X35 DISP (ENDOMECHANICALS) ×1 IMPLANT
SET CHOLANGIOGRAPH MIX (MISCELLANEOUS) IMPLANT
SET TUBE SMOKE EVAC HIGH FLOW (TUBING) ×1 IMPLANT
SLEEVE Z-THREAD 5X100MM (TROCAR) ×2 IMPLANT
SPIKE FLUID TRANSFER (MISCELLANEOUS) ×1 IMPLANT
SUT MNCRL AB 4-0 PS2 18 (SUTURE) ×1 IMPLANT
SYS BAG RETRIEVAL 10MM (BASKET)
SYSTEM BAG RETRIEVAL 10MM (BASKET) IMPLANT
TOWEL OR 17X26 10 PK STRL BLUE (TOWEL DISPOSABLE) ×1 IMPLANT
TOWEL OR NON WOVEN STRL DISP B (DISPOSABLE) IMPLANT
TRAY LAPAROSCOPIC (CUSTOM PROCEDURE TRAY) ×1 IMPLANT
TROCAR ADV FIXATION 12X100MM (TROCAR) IMPLANT
TROCAR BALLN 12MMX100 BLUNT (TROCAR) IMPLANT
TROCAR Z-THREAD OPTICAL 5X100M (TROCAR) ×1 IMPLANT

## 2022-10-18 NOTE — Anesthesia Preprocedure Evaluation (Addendum)
Anesthesia Evaluation  Patient identified by MRN, date of birth, ID band Patient awake    Reviewed: Allergy & Precautions, NPO status , Patient's Chart, lab work & pertinent test results  Airway Mallampati: I  TM Distance: >3 FB Neck ROM: Full    Dental no notable dental hx. (+) Teeth Intact, Dental Advisory Given   Pulmonary asthma , Current Smoker and Patient abstained from smoking.   Pulmonary exam normal breath sounds clear to auscultation       Cardiovascular negative cardio ROS Normal cardiovascular exam Rhythm:Regular Rate:Normal     Neuro/Psych  PSYCHIATRIC DISORDERS  Depression    negative neurological ROS     GI/Hepatic negative GI ROS, Neg liver ROS,,,  Endo/Other    Morbid obesity (BMI 45)  Renal/GU negative Renal ROS  negative genitourinary   Musculoskeletal negative musculoskeletal ROS (+)    Abdominal   Peds  Hematology negative hematology ROS (+)   Anesthesia Other Findings   Reproductive/Obstetrics                             Anesthesia Physical Anesthesia Plan  ASA: 3  Anesthesia Plan: General   Post-op Pain Management: Tylenol PO (pre-op)*   Induction: Intravenous  PONV Risk Score and Plan: 2 and Midazolam, Dexamethasone and Ondansetron  Airway Management Planned: Oral ETT  Additional Equipment:   Intra-op Plan:   Post-operative Plan: Extubation in OR  Informed Consent: I have reviewed the patients History and Physical, chart, labs and discussed the procedure including the risks, benefits and alternatives for the proposed anesthesia with the patient or authorized representative who has indicated his/her understanding and acceptance.     Dental advisory given  Plan Discussed with: CRNA  Anesthesia Plan Comments:        Anesthesia Quick Evaluation

## 2022-10-18 NOTE — Anesthesia Postprocedure Evaluation (Signed)
Anesthesia Post Note  Patient: Dana Woods  Procedure(s) Performed: LAPAROSCOPIC CHOLECYSTECTOMY WITH INTRAOPERATIVE CHOLANGIOGRAM (Bilateral)     Patient location during evaluation: PACU Anesthesia Type: General Level of consciousness: awake and alert Pain management: pain level controlled Vital Signs Assessment: post-procedure vital signs reviewed and stable Respiratory status: spontaneous breathing, nonlabored ventilation, respiratory function stable and patient connected to nasal cannula oxygen Cardiovascular status: blood pressure returned to baseline and stable Postop Assessment: no apparent nausea or vomiting Anesthetic complications: no  No notable events documented.  Last Vitals:  Vitals:   10/18/22 1615 10/18/22 1630  BP: 123/72 128/80  Pulse: (!) 58 (!) 51  Resp: (!) 21 17  Temp:    SpO2: 99% 99%    Last Pain:  Vitals:   10/18/22 1630  TempSrc:   PainSc: 1                  Tifani Dack S

## 2022-10-18 NOTE — H&P (Signed)
[facility] Admission Note  Dana Woods 08/07/2001  [id number].    Requesting MD: Deno Etienne Chief Complaint/Reason for Consult: acute cholecystitis   HPI:  Dana Woods is a 22 y.o. female PMH obesity BMI 52 who presented to [facility] today with worsening RUQ abdominal pain. Patient reports intermittent abdominal symptoms over the last year attributed to her gallbladder. She has been seen in our office by Dr. Redmond Pulling and was scheduled for a laparoscopic cholecystectomy on 11/04/22. States that she was doing well until last night when she developed severe RUQ pain, worse than any prior attack. Associated symptoms include nausea, vomiting, and constipation. In the ED today she is hemodynamically stable. WBC, LFTs, and lipase WNL. U/s shows substantial abnormal appearance of the gallbladder with abnormal wall thickening and similar echogenicity of gallbladder luminal contents compared to the wall resulting in difficulty in measuring wall thickness; intraluminal contents include sludge and gallstone; sonographic Percell Miller sign is present; findings are suspicious for acute cholecystitis. General surgery asked to see.  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 No pertinent surgical history.  Social History:  reports that she has been smoking cigars. She has never used smokeless tobacco. She reports that she does not currently use alcohol. She reports that she does not use  drugs.  Allergies:  Allergies  Allergen Reactions   Nickel Other (See Comments)    (Not in a hospital admission)   Prior to Admission medications   Medication Sig Start Date End Date Taking? Authorizing Provider  cephALEXin (KEFLEX) 500 MG capsule Take 1 capsule (500 mg total) by mouth 2 (two) times daily. 07/13/19   Vanessa Kick, MD  Norethindrone Acetate-Ethinyl Estrad-FE (LOESTRIN 24 FE) 1-20 MG-MCG(24) tablet Take 1 tablet by mouth daily. 03/10/21   Anyanwu, Sallyanne Havers, MD  FLUoxetine (PROZAC) 20 MG capsule Take 1 capsule (20 mg total) by mouth daily. 02/08/19 07/13/19  Roselind Messier, MD  ipratropium (ATROVENT) 0.06 % nasal spray Place 2 sprays into both nostrils 3 (three) times daily. 11/02/17 07/13/19  Zigmund Gottron, NP    Blood pressure (!) 108/58, pulse (!) 57, temperature 98.3 F (36.8 C), temperature source Oral, resp. rate 14, weight (!) 146 kg, SpO2 100 %. Physical Exam: General: pleasant, WD/WN female who is laying in bed in NAD HEENT: head is normocephalic, atraumatic.  Sclera are noninjected.  Pupils equal and round.  Ears and nose without any masses or lesions.  Mouth is pink and moist. Dentition fair Heart: regular, rate, and rhythm Lungs: CTAB, no wheezes, rhonchi, or rales noted.  Respiratory effort nonlabored Abd: obese, soft, ND, +BS, no masses, hernias, or organomegaly. Tender in the RUQ, no peritonitis Skin: warm and dry with no masses, lesions, or rashes Psych: A&Ox4 with an appropriate affect Neuro: MAEs, no gross motor or sensory deficits BUE/BLE  Results for orders placed or performed during the hospital encounter of 10/18/22 (  from the past 48 hour(s))  CBC with Differential     Status: Abnormal   Collection Time: 10/18/22  8:50 AM  Result Value Ref Range   WBC 7.7 4.0 - 10.5 K/uL   RBC 3.91 3.87 - 5.11 MIL/uL   Hemoglobin 11.1 (L) 12.0 - 15.0 g/dL   HCT 34.6 (L) 36.0 - 46.0 %   MCV 88.5 80.0 - 100.0 fL   MCH 28.4 26.0 - 34.0 pg   MCHC 32.1 30.0  - 36.0 g/dL   RDW 14.6 11.5 - 15.5 %   Platelets 284 150 - 400 K/uL   nRBC 0.0 0.0 - 0.2 %   Neutrophils Relative % 65 %   Neutro Abs 5.1 1.7 - 7.7 K/uL   Lymphocytes Relative 26 %   Lymphs Abs 2.0 0.7 - 4.0 K/uL   Monocytes Relative 5 %   Monocytes Absolute 0.4 0.1 - 1.0 K/uL   Eosinophils Relative 2 %   Eosinophils Absolute 0.2 0.0 - 0.5 K/uL   Basophils Relative 1 %   Basophils Absolute 0.1 0.0 - 0.1 K/uL   Immature Granulocytes 1 %   Abs Immature Granulocytes 0.04 0.00 - 0.07 K/uL    Comment: Performed at [facility], [place] [address], [place], [place] [address]  Comprehensive metabolic panel     Status: Abnormal   Collection Time: 10/18/22  8:50 AM  Result Value Ref Range   Sodium 138 135 - 145 mmol/L   Potassium 4.1 3.5 - 5.1 mmol/L   Chloride 110 98 - 111 mmol/L   CO2 23 22 - 32 mmol/L   Glucose, Bld 90 70 - 99 mg/dL    Comment: Glucose reference range applies only to samples taken after fasting for at least 8 hours.   BUN 18 6 - 20 mg/dL   Creatinine, Ser 0.99 0.44 - 1.00 mg/dL   Calcium 8.6 (L) 8.9 - 10.3 mg/dL   Total Protein 6.6 6.5 - 8.1 g/dL   Albumin 3.4 (L) 3.5 - 5.0 g/dL   AST 14 (L) 15 - 41 U/L   ALT 13 0 - 44 U/L   Alkaline Phosphatase 71 38 - 126 U/L   Total Bilirubin 0.7 0.3 - 1.2 mg/dL   GFR, Estimated >60 >60 mL/min    Comment: (NOTE) Calculated using the CKD-EPI Creatinine Equation (2021)    Anion gap 5 5 - 15    Comment: Performed at [facility], [place] [address] [place], [place] [address]  Lipase, blood     Status: None   Collection Time: 10/18/22  8:50 AM  Result Value Ref Range   Lipase 31 11 - 51 U/L    Comment: Performed at [facility], [place] [address] [place], [place] [address]  I-Stat beta hCG blood, ED     Status: None   Collection Time: 10/18/22  8:50 AM  Result Value Ref Range   I-stat hCG, quantitative <5.0 <5 mIU/mL   Comment 3            Comment:   GEST. AGE      CONC.   (mIU/mL)   <=1 WEEK        5 - 50     2 WEEKS       50 - 500     3 WEEKS       100 - 10,000     4 WEEKS     1,000 - 30,000  FEMALE AND NON-PREGNANT FEMALE:     LESS THAN 5 mIU/mL    US Abdomen Limited RUQ (LIVER/GB)  Result Date: 10/18/2022 CLINICAL DATA:  Right upper quadrant abdominal pain EXAM: ULTRASOUND ABDOMEN LIMITED RIGHT UPPER QUADRANT COMPARISON:  None Available. 10/19/2021 FINDINGS: Gallbladder: Substantial abnormal appearance of the gallbladder with abnormal wall thickening and similar echogenicity of gallbladder luminal contents compared to the wall resulting in difficulty in measuring wall thickness. This is likely result of stones and sludge within the gallbladder along with abnormal gallbladder wall thickening. Sonographic Percell Miller sign is present. Common bile duct: Diameter: 0.6 cm.  No directly visualized choledocholithiasis. Liver: No focal lesion identified. Within normal limits in parenchymal echogenicity. Portal vein is patent on color Doppler imaging with normal direction of blood flow towards the liver. Other: None. IMPRESSION: 1. Substantial abnormal appearance of the gallbladder with abnormal wall thickening and similar echogenicity of gallbladder luminal contents compared to the wall resulting in difficulty in measuring wall thickness. Intraluminal contents include sludge and gallstone. Sonographic Percell Miller sign is present. Findings are suspicious for acute cholecystitis. Electronically Signed   By: Van Clines M.D.   On: 10/18/2022 09:13      Assessment/Plan Acute cholecystitis - Patient with clinical and radiographic findings consistent with acute cholecystitis. Recommend admission for observation and laparoscopic cholecystectomy. I have explained the procedure, risks, and aftercare of cholecystectomy.  Risks include but are not limited to bleeding, infection, wound problems, anesthesia, diarrhea, bile leak, injury to common bile duct/liver/intestine.  She seems  to understand and agrees to proceed. IV rocephin has been ordered. Keep NPO. Plan for surgery today pending OR availability. Possible discharge from PACU.    I reviewed ED provider notes, last 24 h vitals and pain scores, last 48 h intake and output, last 24 h labs and trends, and last 24 h imaging results.   Wellington Hampshire, [facility] 10/18/2022, 11:19 AM Please see Amion for pager number during day hours 7:00am-4:30pm

## 2022-10-18 NOTE — Anesthesia Procedure Notes (Signed)
Procedure Name: Intubation Date/Time: 10/18/2022 2:13 PM  Performed by: West Pugh, CRNAPre-anesthesia Checklist: Patient identified, Emergency Drugs available, Suction available, Patient being monitored and Timeout performed Patient Re-evaluated:Patient Re-evaluated prior to induction Oxygen Delivery Method: Circle system utilized Preoxygenation: Pre-oxygenation with 100% oxygen Induction Type: IV induction Ventilation: Mask ventilation without difficulty Laryngoscope Size: Mac and 4 Grade View: Grade I Tube type: Oral Tube size: 7.0 mm Number of attempts: 1 Airway Equipment and Method: Stylet Placement Confirmation: ETT inserted through vocal cords under direct vision, positive ETCO2, CO2 detector and breath sounds checked- equal and bilateral Secured at: 22 cm Tube secured with: Tape Dental Injury: Teeth and Oropharynx as per pre-operative assessment

## 2022-10-18 NOTE — Transfer of Care (Signed)
Immediate Anesthesia Transfer of Care Note  Patient: Dana Woods  Procedure(s) Performed: LAPAROSCOPIC CHOLECYSTECTOMY WITH INTRAOPERATIVE CHOLANGIOGRAM (Bilateral)  Patient Location: PACU  Anesthesia Type:General  Level of Consciousness: awake, alert , oriented, and patient cooperative  Airway & Oxygen Therapy: Patient Spontanous Breathing and Patient connected to face mask oxygen  Post-op Assessment: Report given to RN, Post -op Vital signs reviewed and stable, and Patient moving all extremities X 4  Post vital signs: Reviewed and stable  Last Vitals:  Vitals Value Taken Time  BP 140/84 10/18/22 1523  Temp    Pulse 63 10/18/22 1524  Resp 24 10/18/22 1524  SpO2 99 % 10/18/22 1524  Vitals shown include unvalidated device data.  Last Pain:  Vitals:   10/18/22 1231  TempSrc:   PainSc: 9       Patients Stated Pain Goal: 3 ([id number])  Complications: No notable events documented.

## 2022-10-18 NOTE — Discharge Instructions (Signed)
[facility] LAPAROSCOPIC SURGERY: POST OP INSTRUCTIONS Always review your discharge instruction sheet given to you by the facility where your surgery was performed. IF YOU HAVE DISABILITY OR FAMILY LEAVE FORMS, YOU MUST BRING THEM TO THE OFFICE FOR PROCESSING.   DO NOT GIVE THEM TO YOUR DOCTOR.  PAIN CONTROL  First take acetaminophen (Tylenol) AND/or ibuprofen (Advil) to control your pain after surgery.  Follow directions on package.  Taking acetaminophen (Tylenol) and/or ibuprofen (Advil) regularly after surgery will help to control your pain and lower the amount of prescription pain medication you may need.  You should not take more than 3,000 mg (3 grams) of acetaminophen (Tylenol) in 24 hours.  You should not take ibuprofen (Advil), aleve, motrin, naprosyn or other NSAIDS if you have a history of stomach ulcers or chronic kidney disease.  A prescription for pain medication may be given to you upon discharge.  Take your pain medication as prescribed, if you still have uncontrolled pain after taking acetaminophen (Tylenol) or ibuprofen (Advil). Use ice packs to help control pain. If you need a refill on your pain medication, please contact your pharmacy.  They will contact our office to request authorization. Prescriptions will not be filled after 5pm or on week-ends.  HOME MEDICATIONS Take your usually prescribed medications unless otherwise directed.  DIET You should follow a light diet the first few days after arrival home.  Be sure to include lots of fluids daily. Avoid fatty, fried foods.   CONSTIPATION It is common to experience some constipation after surgery and if you are taking pain medication.  Increasing fluid intake and taking a stool softener (such as Colace) will usually help or prevent this problem from occurring.  A mild laxative (Milk of Magnesia or Miralax) should be taken according to package instructions if there are no bowel movements after 48  hours.  WOUND/INCISION CARE Most patients will experience some swelling and bruising in the area of the incisions.  Ice packs will help.  Swelling and bruising can take several days to resolve.  Unless discharge instructions indicate otherwise, follow guidelines below  STERI-STRIPS - you may remove your outer bandages 48 hours after surgery, and you may shower at that time.  You have steri-strips (small skin tapes) in place directly over the incision.  These strips should be left on the skin for 7-10 days.   DERMABOND/SKIN GLUE - you may shower in 24 hours.  The glue will flake off over the next 2-3 weeks. Any sutures or staples will be removed at the office during your follow-up visit.  ACTIVITIES You may resume regular (light) daily activities beginning the next day--such as daily self-care, walking, climbing stairs--gradually increasing activities as tolerated.  You may have sexual intercourse when it is comfortable.  Refrain from any heavy lifting or straining until approved by your doctor. You may drive when you are no longer taking prescription pain medication, you can comfortably wear a seatbelt, and you can safely maneuver your car and apply brakes.  FOLLOW-UP You should see your doctor in the office for a follow-up appointment approximately 2-3 weeks after your surgery.  You should have been given your post-op/follow-up appointment when your surgery was scheduled.  If you did not receive a post-op/follow-up appointment, make sure that you call for this appointment within a day or two after you arrive home to insure a convenient appointment time.   WHEN TO CALL YOUR DOCTOR: Fever over 101.0 Inability to urinate Continued bleeding from incision.   Increased pain, redness, or drainage from the incision. Increasing abdominal pain  The clinic staff is available to answer your questions during regular business hours.  Please don't hesitate to call and ask to speak to one of the nurses for  clinical concerns.  If you have a medical emergency, go to the nearest emergency room or call 911.  A surgeon from [facility] is always on call at the hospital. [address], [address], [place], [place]  [address] ? [address] [address], [place], [place]   [address] [phone] ? [phone] ? FAX [phone] Web site: [email or web address]  

## 2022-10-18 NOTE — Op Note (Signed)
MACKENSI IMIG [id number] 2001/04/17 10/18/2022  Laparoscopic Cholecystectomy with near infrared fluorescent cholangiography procedure Note  Indications: This patient presents with symptomatic gallbladder disease and will undergo laparoscopic cholecystectomy.  Pre-operative Diagnosis: Calculus of gallbladder with other cholecystitis, without mention of obstruction  Post-operative Diagnosis: Same  Surgeon: Greer Pickerel MD FACS  Assistants: none  Anesthesia: General endotracheal anesthesia  Procedure Details  The patient was seen again in the Holding Room. The risks, benefits, complications, treatment options, and expected outcomes were discussed with the patient. The possibilities of reaction to medication, pulmonary aspiration, perforation of viscus, bleeding, recurrent infection, finding a normal gallbladder, the need for additional procedures, failure to diagnose a condition, the possible need to convert to an open procedure, and creating a complication requiring transfusion or operation were discussed with the patient. The likelihood of improving the patient's symptoms with return to their baseline status is good.  The patient and/or family concurred with the proposed plan, giving informed consent. The site of surgery properly noted. The patient was taken to Operating Room, identified as Tawnya Crook and the procedure verified as Laparoscopic Cholecystectomy with ICG dye.  A Time Out was held and the above information confirmed. Antibiotic prophylaxis was administered.    ICG dye was administered preoperatively.    General endotracheal anesthesia was then administered and tolerated well. After the induction, the abdomen was prepped with Chloraprep and draped in the sterile fashion. The patient was positioned in the supine position.  Local anesthetic agent was injected into the skin near the umbilicus and an incision made. We dissected down to the abdominal fascia with blunt dissection.  The  fascia was incised vertically and we entered the peritoneal cavity bluntly.  A pursestring suture of 0-Vicryl was placed around the fascial opening.  The Hasson cannula was inserted and secured with the stay suture.  Pneumoperitoneum was then created with CO2 and tolerated well without any adverse changes in the patient's vital signs. An 5-mm port was placed in the subxiphoid position.  Two 5-mm ports were placed in the right upper quadrant. All skin incisions were infiltrated with a local anesthetic agent before making the incision and placing the trocars.   We positioned the patient in reverse Trendelenburg, tilted slightly to the patient's left.  The gallbladder was identified, the fundus grasped and retracted cephalad. The gallbladder was full of stones and appeared chronically inflammed. Adhesions were lysed bluntly and with the electrocautery where indicated, taking care not to injure any adjacent organs or viscus. The infundibulum was grasped and retracted laterally, exposing the peritoneum overlying the triangle of Calot. This was then divided and exposed in a blunt fashion. A critical view of the cystic duct and cystic artery was obtained.  The cystic duct was clearly identified and bluntly dissected circumferentially.  Utilizing the Stryker camera system near infrared fluorescent activity was visualized in the liver, cystic duct, common hepatic duct and common bile duct and small bowel.  This served as a secondary confirmation of our anatomy.  The cystic duct was then ligated with clips and divided. The cystic artery which had been identified & dissected free was ligated with clips and divided as well.   The gallbladder was dissected from the liver bed in retrograde fashion with the electrocautery.  There was some bile spillage from the gallbladder but no gallstone spillage. The gallbladder was removed and placed in an Ecco sac.  The gallbladder and Ecco sac were then removed through the umbilical  port site. The liver bed  was irrigated and inspected. Hemostasis was achieved with the electrocautery. Copious irrigation was utilized and was repeatedly aspirated until clear.  The pursestring suture was used to close the umbilical fascia.  I did place an additional interrupted suture in the umbilical fascia with a PMI suture passer with laparoscopic assistance.  We again inspected the right upper quadrant for hemostasis.  The umbilical closure was inspected and there was no air leak and nothing trapped within the closure. Pneumoperitoneum was released as we removed the trocars.  4-0 Monocryl was used to close the skin.  Dermabond was applied. The patient was then extubated and brought to the recovery room in stable condition. Instrument, sponge, and needle counts were correct at closure and at the conclusion of the case.   Findings: Probable Cholecystitis with Cholelithiasis +critical view  Estimated Blood Loss: Minimal         Drains: none         Specimens: Gallbladder           Complications: None; patient tolerated the procedure well.         Disposition: PACU - hemodynamically stable.         Condition: stable  Leighton Ruff. Redmond Pulling, MD, FACS General, Bariatric, & Minimally Invasive Surgery [facility],  [place]

## 2022-10-18 NOTE — ED Provider Notes (Signed)
[place] EMERGENCY DEPARTMENT AT [facility] Provider Note   CSN: [id number] Arrival date & time: 10/18/22  0741     History  Chief Complaint  Patient presents with   Abdominal Pain    Dana Woods is a 22 y.o. female.  Patient with history of cholelithiasis, due for cholecystectomy on 3/15 --presents to the emergency department today for evaluation of right upper quadrant abdominal pain with nausea and vomiting.  Patient states that she woke up early this morning with the urge to have a bowel movement.  She did not feel well and then shortly afterwards she developed right upper quadrant pain with radiation to the back.  This pain is more severe than what she is used to with gallbladder attacks.  It has been persistent.  No associated fever or urinary symptoms.       Home Medications Prior to Admission medications   Medication Sig Start Date End Date Taking? Authorizing Provider  cephALEXin (KEFLEX) 500 MG capsule Take 1 capsule (500 mg total) by mouth 2 (two) times daily. 07/13/19   Vanessa Kick, MD  Norethindrone Acetate-Ethinyl Estrad-FE (LOESTRIN 24 FE) 1-20 MG-MCG(24) tablet Take 1 tablet by mouth daily. 03/10/21   Anyanwu, Sallyanne Havers, MD  FLUoxetine (PROZAC) 20 MG capsule Take 1 capsule (20 mg total) by mouth daily. 02/08/19 07/13/19  Roselind Messier, MD  ipratropium (ATROVENT) 0.06 % nasal spray Place 2 sprays into both nostrils 3 (three) times daily. 11/02/17 07/13/19  Zigmund Gottron, NP      Allergies    Nickel    Review of Systems   Review of Systems  Physical Exam Updated Vital Signs BP (!) 124/92 (BP Location: Right Arm)   Pulse 77   Temp 98.9 F (37.2 C) (Oral)   Resp 20   Wt (!) 146 kg   SpO2 99%   BMI 51.95 kg/m   Physical Exam Vitals and nursing note reviewed.  Constitutional:      General: She is not in acute distress.    Appearance: She is well-developed.  HENT:     Head: Normocephalic and atraumatic.     Right Ear: External ear  normal.     Left Ear: External ear normal.     Nose: Nose normal.  Eyes:     Conjunctiva/sclera: Conjunctivae normal.  Cardiovascular:     Rate and Rhythm: Normal rate and regular rhythm.     Heart sounds: No murmur heard. Pulmonary:     Effort: No respiratory distress.     Breath sounds: No wheezing, rhonchi or rales.  Abdominal:     Palpations: Abdomen is soft.     Tenderness: There is abdominal tenderness in the right upper quadrant. There is no guarding or rebound. Negative signs include Murphy's sign and McBurney's sign.  Musculoskeletal:     Cervical back: Normal range of motion and neck supple.     Right lower leg: No edema.     Left lower leg: No edema.  Skin:    General: Skin is warm and dry.     Findings: No rash.  Neurological:     General: No focal deficit present.     Mental Status: She is alert. Mental status is at baseline.     Motor: No weakness.  Psychiatric:        Mood and Affect: Mood normal.     ED Results / Procedures / Treatments   Labs (all labs ordered are listed, but only abnormal results are displayed)  Labs Reviewed  CBC WITH DIFFERENTIAL/PLATELET  COMPREHENSIVE METABOLIC PANEL  LIPASE, BLOOD  I-STAT BETA HCG BLOOD, ED ([facility], [facility], [facility] ONLY)    EKG None  Radiology No results found.  Procedures Procedures    Medications Ordered in ED Medications  cefTRIAXone (ROCEPHIN) 2 g in sodium chloride 0.9 % 100 mL IVPB (2 g Intravenous New Bag/Given 10/18/22 0953)  HYDROmorphone (DILAUDID) injection 0.5 mg (0.5 mg Intravenous Given 10/18/22 0841)  ondansetron (ZOFRAN) injection 4 mg (4 mg Intravenous Given 10/18/22 0841)  sodium chloride 0.9 % bolus 1,000 mL (1,000 mLs Intravenous New Bag/Given 10/18/22 0841)    ED Course/ Medical Decision Making/ A&P Clinical Course as of 10/18/22 0955  Tue Oct 18, 2022  0953 Weight(!): 146 kg [JG]    Clinical Course User Index [JG] Carlisle Cater, PA-C    Patient seen and examined. History obtained directly  from patient. Work-up including labs, imaging, EKG ordered in triage, if performed, were reviewed.    Labs/EKG: Independently reviewed and interpreted.  This included: CBC, CMP, lipase, pregnancy test  Imaging: Independently visualized and interpreted.  This included: Right upper quadrant ultrasound  Medications/Fluids: Ordered: Dilaudid IV, Zofran IV, IV fluid bolus  Most recent vital signs reviewed and are as follows: BP (!) 124/92 (BP Location: Right Arm)   Pulse 77   Temp 98.9 F (37.2 C) (Oral)   Resp 20   Wt (!) 146 kg   SpO2 99%   BMI 51.95 kg/m   Initial impression: Right upper quadrant abdominal pain  9:55 AM Reassessment performed. Patient appears stable.  Sleepy after pain medication, more comfortable.  Labs personally reviewed and interpreted including: CBC with white blood cell count normal at 7.7, hemoglobin 11.1; CMP with normal LFTs; lipase normal; pregnancy negative.  Imaging personally visualized and interpreted including: Ultrasound, gallstones noted.  Reviewed pertinent lab work and imaging with patient at bedside. Questions answered.   Most current vital signs reviewed and are as follows: BP (!) 124/92 (BP Location: Right Arm)   Pulse 77   Temp 98.9 F (37.2 C) (Oral)   Resp 20   Wt (!) 146 kg   SpO2 99%   BMI 51.95 kg/m   Plan: I consulted with Hunt Oris of general surgery, they will see patient in consultation.  I have ordered IV Rocephin.                            Medical Decision Making Amount and/or Complexity of Data Reviewed Labs: ordered. Radiology: ordered.  Risk Prescription drug management. Decision regarding hospitalization.   For this patient's complaint of abdominal pain, the following conditions were considered on the differential diagnosis: gastritis/PUD, enteritis/duodenitis, appendicitis, cholelithiasis/cholecystitis, cholangitis, pancreatitis, ruptured viscus, colitis, diverticulitis, small/large bowel obstruction,  proctitis, cystitis, pyelonephritis, ureteral colic, aortic dissection, aortic aneurysm. In women, ectopic pregnancy, pelvic inflammatory disease, ovarian cysts, and tubo-ovarian abscess were also considered. Atypical chest etiologies were also considered including ACS, PE, and pneumonia.  Clinical concern for cholecystitis given persistent right upper quadrant pain in setting of known cholelithiasis.  Low concern today for acute cholangitis or bile duct stone.  Ultrasound is abnormal.  Surgical consultation obtained.        Final Clinical Impression(s) / ED Diagnoses Final diagnoses:  Acute cholecystitis    Rx / DC Orders ED Discharge Orders     None         Carlisle Cater, PA-C 10/18/22 [place], DO 10/18/22  1019  

## 2022-10-18 NOTE — ED Triage Notes (Signed)
C/o Right sided abd pain since this am upon waking with n/v.  Pt reports needing surgery for gallstones.

## 2022-10-19 ENCOUNTER — Encounter (HOSPITAL_COMMUNITY): Payer: Self-pay | Admitting: General Surgery

## 2022-10-23 LAB — SURGICAL PATHOLOGY

## 2022-11-04 ENCOUNTER — Ambulatory Visit: Admit: 2022-11-04 | Payer: Medicaid Other | Admitting: General Surgery

## 2022-11-04 SURGERY — LAPAROSCOPIC CHOLECYSTECTOMY
Anesthesia: General

## 2023-01-10 ENCOUNTER — Encounter: Payer: Medicaid Other | Admitting: Obstetrics

## 2023-01-11 ENCOUNTER — Other Ambulatory Visit: Payer: Self-pay

## 2023-01-11 ENCOUNTER — Emergency Department (HOSPITAL_COMMUNITY): Payer: Medicaid Other

## 2023-01-11 ENCOUNTER — Encounter (HOSPITAL_COMMUNITY): Payer: Self-pay

## 2023-01-11 ENCOUNTER — Inpatient Hospital Stay (HOSPITAL_COMMUNITY)
Admission: EM | Admit: 2023-01-11 | Discharge: 2023-01-15 | DRG: 059 | Disposition: A | Discharge status: Home / Self Care | Payer: Medicaid Other | Attending: Student | Admitting: Student

## 2023-01-11 DIAGNOSIS — G35A Relapsing-remitting multiple sclerosis: Secondary | ICD-10-CM | POA: Diagnosis present

## 2023-01-11 DIAGNOSIS — Z9109 Other allergy status, other than to drugs and biological substances: Secondary | ICD-10-CM | POA: Diagnosis not present

## 2023-01-11 DIAGNOSIS — R202 Paresthesia of skin: Secondary | ICD-10-CM | POA: Diagnosis not present

## 2023-01-11 DIAGNOSIS — E669 Obesity, unspecified: Secondary | ICD-10-CM

## 2023-01-11 DIAGNOSIS — Z79899 Other long term (current) drug therapy: Secondary | ICD-10-CM | POA: Diagnosis not present

## 2023-01-11 DIAGNOSIS — H5213 Myopia, bilateral: Secondary | ICD-10-CM | POA: Diagnosis not present

## 2023-01-11 DIAGNOSIS — E7800 Pure hypercholesterolemia, unspecified: Secondary | ICD-10-CM | POA: Diagnosis not present

## 2023-01-11 DIAGNOSIS — G9389 Other specified disorders of brain: Secondary | ICD-10-CM | POA: Diagnosis not present

## 2023-01-11 DIAGNOSIS — Z1629 Resistance to other single specified antibiotic: Secondary | ICD-10-CM | POA: Diagnosis not present

## 2023-01-11 DIAGNOSIS — N926 Irregular menstruation, unspecified: Secondary | ICD-10-CM | POA: Diagnosis present

## 2023-01-11 DIAGNOSIS — N3090 Cystitis, unspecified without hematuria: Secondary | ICD-10-CM | POA: Diagnosis present

## 2023-01-11 DIAGNOSIS — E559 Vitamin D deficiency, unspecified: Secondary | ICD-10-CM | POA: Diagnosis not present

## 2023-01-11 DIAGNOSIS — Q6689 Other  specified congenital deformities of feet: Secondary | ICD-10-CM

## 2023-01-11 DIAGNOSIS — Z973 Presence of spectacles and contact lenses: Secondary | ICD-10-CM

## 2023-01-11 DIAGNOSIS — Z9049 Acquired absence of other specified parts of digestive tract: Secondary | ICD-10-CM

## 2023-01-11 DIAGNOSIS — I10 Essential (primary) hypertension: Secondary | ICD-10-CM | POA: Diagnosis not present

## 2023-01-11 DIAGNOSIS — F17290 Nicotine dependence, other tobacco product, uncomplicated: Secondary | ICD-10-CM | POA: Diagnosis present

## 2023-01-11 DIAGNOSIS — R200 Anesthesia of skin: Secondary | ICD-10-CM | POA: Diagnosis not present

## 2023-01-11 DIAGNOSIS — Z91041 Radiographic dye allergy status: Secondary | ICD-10-CM | POA: Diagnosis not present

## 2023-01-11 DIAGNOSIS — G35 Multiple sclerosis: Secondary | ICD-10-CM | POA: Diagnosis not present

## 2023-01-11 DIAGNOSIS — Z6841 Body Mass Index (BMI) 40.0 and over, adult: Secondary | ICD-10-CM

## 2023-01-11 DIAGNOSIS — R531 Weakness: Secondary | ICD-10-CM

## 2023-01-11 DIAGNOSIS — R001 Bradycardia, unspecified: Secondary | ICD-10-CM | POA: Diagnosis not present

## 2023-01-11 DIAGNOSIS — F322 Major depressive disorder, single episode, severe without psychotic features: Secondary | ICD-10-CM | POA: Diagnosis not present

## 2023-01-11 DIAGNOSIS — B964 Proteus (mirabilis) (morganii) as the cause of diseases classified elsewhere: Secondary | ICD-10-CM | POA: Diagnosis present

## 2023-01-11 DIAGNOSIS — J4520 Mild intermittent asthma, uncomplicated: Secondary | ICD-10-CM | POA: Diagnosis present

## 2023-01-11 LAB — CBC WITH DIFFERENTIAL/PLATELET
Abs Immature Granulocytes: 0.01 10*3/uL (ref 0.00–0.07)
Basophils Absolute: 0.1 10*3/uL (ref 0.0–0.1)
Basophils Relative: 1 %
Eosinophils Absolute: 0.2 10*3/uL (ref 0.0–0.5)
Eosinophils Relative: 2 %
HCT: 37.9 % (ref 36.0–46.0)
Hemoglobin: 11.8 g/dL — ABNORMAL LOW (ref 12.0–15.0)
Immature Granulocytes: 0 %
Lymphocytes Relative: 31 %
Lymphs Abs: 2.3 10*3/uL (ref 0.7–4.0)
MCH: 27.8 pg (ref 26.0–34.0)
MCHC: 31.1 g/dL (ref 30.0–36.0)
MCV: 89.4 fL (ref 80.0–100.0)
Monocytes Absolute: 0.4 10*3/uL (ref 0.1–1.0)
Monocytes Relative: 5 %
Neutro Abs: 4.6 10*3/uL (ref 1.7–7.7)
Neutrophils Relative %: 61 %
Platelets: 290 10*3/uL (ref 150–400)
RBC: 4.24 MIL/uL (ref 3.87–5.11)
RDW: 13.8 % (ref 11.5–15.5)
WBC: 7.5 10*3/uL (ref 4.0–10.5)
nRBC: 0 % (ref 0.0–0.2)

## 2023-01-11 LAB — COMPREHENSIVE METABOLIC PANEL
ALT: 14 U/L (ref 0–44)
AST: 15 U/L (ref 15–41)
Albumin: 3.9 g/dL (ref 3.5–5.0)
Alkaline Phosphatase: 79 U/L (ref 38–126)
Anion gap: 1 — ABNORMAL LOW (ref 5–15)
BUN: 15 mg/dL (ref 6–20)
CO2: 25 mmol/L (ref 22–32)
Calcium: 8.8 mg/dL — ABNORMAL LOW (ref 8.9–10.3)
Chloride: 110 mmol/L (ref 98–111)
Creatinine, Ser: 0.86 mg/dL (ref 0.44–1.00)
GFR, Estimated: >60 mL/min (ref >60)
Glucose, Bld: 81 mg/dL (ref 70–99)
Potassium: 3.8 mmol/L (ref 3.5–5.1)
Sodium: 136 mmol/L (ref 135–145)
Total Bilirubin: 0.6 mg/dL (ref 0.3–1.2)
Total Protein: 7.7 g/dL (ref 6.5–8.1)

## 2023-01-11 LAB — CBC
HCT: 35.4 % — ABNORMAL LOW (ref 36.0–46.0)
Hemoglobin: 11.4 g/dL — ABNORMAL LOW (ref 12.0–15.0)
MCH: 28.4 pg (ref 26.0–34.0)
MCHC: 32.2 g/dL (ref 30.0–36.0)
MCV: 88.1 fL (ref 80.0–100.0)
Platelets: 307 10*3/uL (ref 150–400)
RBC: 4.02 MIL/uL (ref 3.87–5.11)
RDW: 13.7 % (ref 11.5–15.5)
WBC: 8.2 10*3/uL (ref 4.0–10.5)
nRBC: 0 % (ref 0.0–0.2)

## 2023-01-11 LAB — URINALYSIS, ROUTINE W REFLEX MICROSCOPIC
Bilirubin Urine: NEGATIVE
Glucose, UA: NEGATIVE mg/dL
Ketones, ur: 5 mg/dL — AB
Nitrite: NEGATIVE
Protein, ur: 30 mg/dL — AB
Specific Gravity, Urine: 1.028 (ref 1.005–1.030)
WBC, UA: >50 WBC/hpf (ref 0–5)
pH: 6 (ref 5.0–8.0)

## 2023-01-11 LAB — MAGNESIUM: Magnesium: 2.2 mg/dL (ref 1.7–2.4)

## 2023-01-11 LAB — GLUCOSE, CAPILLARY: Glucose-Capillary: 108 mg/dL — ABNORMAL HIGH (ref 70–99)

## 2023-01-11 LAB — HIV ANTIBODY (ROUTINE TESTING W REFLEX): HIV Screen 4th Generation wRfx: NONREACTIVE

## 2023-01-11 LAB — CREATININE, SERUM
Creatinine, Ser: 0.82 mg/dL (ref 0.44–1.00)
GFR, Estimated: >60 mL/min (ref >60)

## 2023-01-11 LAB — PREGNANCY, URINE: Preg Test, Ur: NEGATIVE

## 2023-01-11 MED ORDER — PANTOPRAZOLE SODIUM 40 MG IV SOLR
40.0000 mg | INTRAVENOUS | Status: DC
  Administered 2023-01-12 – 2023-01-14 (×3): 40 mg via INTRAVENOUS
  Filled 2023-01-11 (×3): qty 10

## 2023-01-11 MED ORDER — GADOBUTROL 1 MMOL/ML IV SOLN
10.0000 mL | Freq: Once | INTRAVENOUS | Status: AC | PRN
  Administered 2023-01-11: 10 mL via INTRAVENOUS

## 2023-01-11 MED ORDER — HYDRALAZINE HCL 20 MG/ML IJ SOLN: 10.0000 mg | Freq: Three times a day (TID) | INTRAMUSCULAR | Status: DC | PRN

## 2023-01-11 MED ORDER — PANTOPRAZOLE SODIUM 40 MG IV SOLR
40.0000 mg | INTRAVENOUS | Status: DC
  Administered 2023-01-11: 40 mg via INTRAVENOUS
  Filled 2023-01-11: qty 10

## 2023-01-11 MED ORDER — SODIUM CHLORIDE 0.9 % IV SOLN: INTRAVENOUS | Status: AC

## 2023-01-11 MED ORDER — ONDANSETRON HCL 4 MG PO TABS: 4.0000 mg | ORAL_TABLET | Freq: Four times a day (QID) | ORAL | Status: DC | PRN

## 2023-01-11 MED ORDER — ENOXAPARIN SODIUM 40 MG/0.4ML IJ SOSY
40.0000 mg | PREFILLED_SYRINGE | INTRAMUSCULAR | Status: DC
  Administered 2023-01-12 – 2023-01-14 (×3): 40 mg via SUBCUTANEOUS
  Filled 2023-01-11 (×3): qty 0.4

## 2023-01-11 MED ORDER — ONDANSETRON HCL 4 MG/2ML IJ SOLN: 4.0000 mg | Freq: Four times a day (QID) | INTRAMUSCULAR | Status: DC | PRN

## 2023-01-11 MED ORDER — SODIUM CHLORIDE 0.9 % IV SOLN
1000.0000 mg | INTRAVENOUS | Status: DC
  Administered 2023-01-11: 1000 mg via INTRAVENOUS
  Filled 2023-01-11 (×2): qty 16

## 2023-01-11 MED ORDER — ACETAMINOPHEN 650 MG RE SUPP: 650.0000 mg | Freq: Four times a day (QID) | RECTAL | Status: DC | PRN

## 2023-01-11 MED ORDER — SODIUM CHLORIDE 0.9 % IV SOLN
1000.0000 mg | INTRAVENOUS | Status: DC
  Administered 2023-01-12 – 2023-01-14 (×3): 1000 mg via INTRAVENOUS
  Filled 2023-01-11 (×4): qty 16

## 2023-01-11 MED ORDER — SODIUM CHLORIDE 0.9 % IV SOLN
2.0000 g | Freq: Once | INTRAVENOUS | Status: AC
  Administered 2023-01-11: 2 g via INTRAVENOUS
  Filled 2023-01-11: qty 20

## 2023-01-11 MED ORDER — ACETAMINOPHEN 325 MG PO TABS
650.0000 mg | ORAL_TABLET | Freq: Four times a day (QID) | ORAL | Status: DC | PRN
  Administered 2023-01-14 – 2023-01-15 (×2): 650 mg via ORAL
  Filled 2023-01-11 (×2): qty 2

## 2023-01-11 MED ORDER — DOCUSATE SODIUM 100 MG PO CAPS
100.0000 mg | ORAL_CAPSULE | Freq: Two times a day (BID) | ORAL | Status: DC
  Administered 2023-01-11 – 2023-01-15 (×8): 100 mg via ORAL
  Filled 2023-01-11 (×8): qty 1

## 2023-01-11 MED ORDER — SODIUM CHLORIDE 0.9 % IV SOLN
1.0000 g | INTRAVENOUS | Status: DC
  Administered 2023-01-12: 1 g via INTRAVENOUS
  Filled 2023-01-11: qty 10

## 2023-01-11 NOTE — ED Triage Notes (Signed)
C/o numbness to bilateral toes that started 3 days ago.  Numbness now radiates to torso.  Pt denies being able to feel me touching her legs and feet in triage.  Pt ambulatory to triage.

## 2023-01-11 NOTE — Consult Note (Signed)
NEUROLOGY CONSULTATION NOTE   Date of service: Jan 11, 2023 Patient Name: Dana Woods MRN:  [id number] DOB:  2001/02/05 Reason for consult: "RLE weakness and numbness" Requesting Provider: Willeen Niece, MD _ _ _   _ __   _ __ _ _  __ __   _ __   __ _  History of Present Illness  Dana Woods is a 22 y.o. female with PMH significant for HLD, obesity, who presents with 4 day hx of LLE weakness and numbness. Started with numbness in L foot, then went up leg and then progressed upwards to pelvis and suprapubic region along with weakness.  She came to the ED and MRI Brain and spine demonstrated demyelinating lesions in the brain that are non enhancing along with a T2/FLAIR lesion at T7-8 with contrast enhancement concerning for active demyelinating lesion.  She was given IV solumedrol and transferred to [facility] for neurology evaluation.    ROS   Constitutional Denies weight loss, fever and chills.   HEENT Denies changes in vision and hearing.   Respiratory Denies SOB and cough.   CV Denies palpitations and CP   GI Denies abdominal pain, nausea, vomiting and diarrhea.   GU Denies dysuria and urinary frequency.   MSK Denies myalgia and joint pain.   Skin Denies rash and pruritus.   Neurological Denies headache and syncope.   Psychiatric Denies recent changes in mood. Denies anxiety and depression.    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   Past Surgical History:  Procedure Laterality Date   CHOLECYSTECTOMY Bilateral 10/18/2022   Procedure: LAPAROSCOPIC CHOLECYSTECTOMY WITH INTRAOPERATIVE CHOLANGIOGRAM;  Surgeon: Gaynelle Adu, MD;  Location: [facility];  Service: General;  Laterality: Bilateral;   Family History  Problem Relation Age of Onset   ADD  / ADHD Sister    Social History   Socioeconomic History   Marital status: Single    Spouse name: Not on file   Number of children: Not on file   Years of education: Not on file   Highest education level: Not on file  Occupational History   Not on file  Tobacco Use   Smoking status: Some Days    Types: Cigars   Smokeless tobacco: Never  Vaping Use   Vaping Use: Never used  Substance and Sexual Activity   Alcohol use: Not Currently   Drug use: Never   Sexual activity: Yes    Partners: Male, Female    Birth control/protection: None, Condom  Other Topics Concern   Not on file  Social History Narrative   Lives with mom, and sister Demetria. Mom smokes inside.   Dad lives nearby with new wife.    Social Determinants of Health   Financial Resource Strain: Not on file  Food Insecurity: No Food Insecurity (01/11/2023)   Hunger Vital Sign    Worried About Running Out of Food in the Last Year: Never true    Ran Out of Food in the Last Year: Never true  Transportation Needs: No Transportation Needs (01/11/2023)   PRAPARE - Administrator, Civil Service (Medical): No    Lack of Transportation (Non-Medical): No  Physical Activity: Not on file  Stress: Not on file  Social Connections: Not on file   Allergies  Allergen Reactions   Gadavist [Gadobutrol] Nausea Only    Nausea right after contrast was given. No other reaction.    Nickel Other (See Comments)    Medications   Medications Prior to Admission  Medication Sig Dispense Refill Last Dose   Norethindrone Acetate-Ethinyl Estrad-FE (LOESTRIN 24 FE) 1-20 MG-MCG(24) tablet Take 1 tablet by mouth daily. (Patient not taking: Reported on 01/11/2023) 28 tablet 11 Not Taking   oxyCODONE (OXY IR/ROXICODONE) 5 MG immediate release tablet Take 1 tablet (5 mg total) by mouth every 6 (six) hours as needed for severe pain. (Patient not taking: Reported on 01/11/2023) 15 tablet 0 Not Taking   polyethylene glycol (MIRALAX) 17 g  packet Take 17 g by mouth daily. (Patient not taking: Reported on 01/11/2023) 14 each 0 Not Taking     Vitals   Vitals:   01/11/23 1030 01/11/23 1301 01/11/23 1330 01/11/23 1602  BP: (!) 152/87 127/74 (!) 140/120 122/79  Pulse: 62 65 (!) 52 72  Resp: 18 18 18 19   Temp:  98.4 F (36.9 C)  98.6 F (37 C)  TempSrc:  Oral  Oral  SpO2: 98% 100% 100% 100%  Weight:         Body mass index is 45.19 kg/m.  Physical Exam   General: Laying comfortably in bed; in no acute distress.  HENT: Normal oropharynx and mucosa. Normal external appearance of ears and nose.  Neck: Supple, no pain or tenderness  CV: No JVD. No peripheral edema.  Pulmonary: Symmetric Chest rise. Normal respiratory effort.  Abdomen: Soft to touch, non-tender.  Ext: No cyanosis, edema, or deformity  Skin: No rash. Normal palpation of skin.   Musculoskeletal: Normal digits and nails by inspection. No clubbing.   Neurologic Examination  Mental status/Cognition: Alert, oriented to self, place, month and year, good attention.  Speech/language: Fluent, comprehension intact, object naming intact, repetition intact.  Cranial nerves:   CN II Pupils equal and reactive to light, no VF deficits    CN III,IV,VI EOM intact, no gaze preference or deviation, no nystagmus    CN V normal sensation in V1, V2, and V3 segments bilaterally    CN VII no asymmetry, no nasolabial fold flattening    CN VIII normal hearing to speech    CN IX & X normal palatal elevation, no uvular deviation    CN XI 5/5 head turn and 5/5 shoulder shrug bilaterally    CN XII midline tongue protrusion    Motor:  Muscle bulk: normal, tone normal, pronator drift none tremor none Mvmt Root Nerve  Muscle Right Left Comments  SA C5/6 Ax Deltoid 5 5   EF C5/6 Mc Biceps 5 5   EE C6/7/8 Rad Triceps 5 5   WF C6/7 Med FCR     WE C7/8 PIN ECU     F Ab C8/T1 U ADM/FDI 5 5   HF L1/2/3 Fem Illopsoas 5 4   KE L2/3/4 Fem Quad 5 5   DF L4/5 D Peron Tib Ant 5 5   PF  S1/2 Tibial Grc/Sol 5 5    Reflexes:  Right Left Comments  Pectoralis      Biceps (C5/6) 2 2   Brachioradialis (C5/6) 2 2    Triceps (C6/7) 2 2    Patellar (L3/4) 2 2    Achilles (S1) 1 1    Hoffman  Plantar mute mute   Jaw jerk    Sensation:  Light touch Paresthesias in LLE to light touch   Pin prick    Temperature    Vibration   Proprioception    Coordination/Complex Motor:  - Finger to Nose intact BL - Heel to shin difficult to do with weakness and body habitus - Rapid alternating movement are normal - Gait: deferred for patient safety.  Labs   CBC:  Recent Labs  Lab 01/11/23 0826 01/11/23 1700  WBC 7.5 8.2  NEUTROABS 4.6  --   HGB 11.8* 11.4*  HCT 37.9 35.4*  MCV 89.4 88.1  PLT 290 307    Basic Metabolic Panel:  Lab Results  Component Value Date   NA 136 01/11/2023   K 3.8 01/11/2023   CO2 25 01/11/2023   GLUCOSE 81 01/11/2023   BUN 15 01/11/2023   CREATININE 0.82 01/11/2023   CALCIUM 8.8 (L) 01/11/2023   GFRNONAA >60 01/11/2023   Lipid Panel:  Lab Results  Component Value Date   LDLCALC 105 10/31/2014   HgbA1c:  Lab Results  Component Value Date   HGBA1C 5.3 10/31/2014   Urine Drug Screen: No results found for: "LABOPIA", "COCAINSCRNUR", "LABBENZ", "AMPHETMU", "THCU", "LABBARB"  Alcohol Level No results found for: "ETH"   MR Brain, C, T, L spine w + w/o C(personally reviewed): Several T2/FLAIR lesions, patterns more consistent with demyelinating disease rather than small vessel. Has T2/FLAIR lesion with enhancement at T7-8 level consistent with active demyelination.  Impression   Dana Woods is a 22 y.o. female with PMH significant for HLD, obesity, who presents with 4 day hx of LLE weakness and numbness. Found to have Several T2/FLAIR lesions, patterns more consistent with demyelinating disease rather than small vessel. Has T2/FLAIR lesion with enhancement at T7-8 level consistent with active demyelination. She now fits the revised  Mcdonald critera for MS. Her neurologic examination is notable for mild LLE weakness and paresthesias.  Recommendations  New diagnosis of Multiple Sclerosis with flare up: - IV solumedrol 1000mg  daily x 5 doses. Can be switched to PO after 2-3 days if she shows consistent improvement and discharged home. - PPI while on steroids - NMO panel, MOG Ab and Vit D levels. - PT and OT. - follow up with Dr. Despina Arias with [facility].  ______________________________________________________________________   Thank you for the opportunity to take part in the care of this patient. If you have any further questions, please contact the neurology consultation attending.  Signed,  Erick Blinks [facility] Pager Number [phone] _ _ _   _ __   _ __ _ _  __ __   _ __   __ _

## 2023-01-11 NOTE — ED Notes (Signed)
Patient transported to MRI 

## 2023-01-11 NOTE — ED Provider Notes (Signed)
[facility] AT [facility] Provider Note   CSN: [id number] Arrival date & time: 01/11/23  0747     History  Chief Complaint  Patient presents with   Numbness    Dana Woods is a 22 y.o. female.  HPI Patient presents for lower extremity numbness and weakness.  Medical history includes HLD, depression, asthma.  Patient states that she was in her normal state of health on Saturday.  On Sunday, she began to experience numbness in bilateral feet.  Over the past 3 days, this has progressed to her upper legs and abdomen.  She also endorses weakness in her legs.  She has had difficulty walking.  Patient works in [employer].  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  During her shift, she is on her feet.  She was able to complete his shift last night.  She states that this was difficult due to the weakness in her legs but she "pushed through it".  When she finished her shift, she came to the ED with her mother.    Home Medications Prior to Admission medications   Medication Sig Start Date End Date Taking? Authorizing Provider  Norethindrone Acetate-Ethinyl Estrad-FE (LOESTRIN 24 FE) 1-20 MG-MCG(24) tablet Take 1 tablet by mouth daily. Patient not taking: Reported on 01/11/2023 03/10/21   Tereso Newcomer, MD  oxyCODONE (OXY IR/ROXICODONE) 5 MG immediate release tablet Take 1 tablet (5 mg total) by mouth every 6 (six) hours as needed for severe pain. Patient not taking: Reported on 01/11/2023 10/18/22   Carlena Bjornstad A, PA-C  polyethylene glycol (MIRALAX) 17 g packet Take 17 g by mouth daily. Patient not taking: Reported on 01/11/2023 10/18/22   Franne Forts, PA-C  FLUoxetine (PROZAC) 20 MG capsule Take 1 capsule (20 mg total) by mouth daily. 02/08/19 07/13/19  Theadore Nan, MD  ipratropium (ATROVENT) 0.06 % nasal spray Place 2 sprays into both nostrils 3 (three) times daily. 11/02/17 07/13/19  Georgetta Haber, NP      Allergies    Gadavist  [gadobutrol] and Nickel    Review of Systems   Review of Systems  Neurological:  Positive for weakness and numbness.  All other systems reviewed and are negative.   Physical Exam Updated Vital Signs BP (!) 140/120   Pulse (!) 52   Temp 98.4 F (36.9 C) (Oral)   Resp 18   Wt 127 kg   SpO2 100%   BMI 45.19 kg/m  Physical Exam Vitals and nursing note reviewed.  Constitutional:      General: She is not in acute distress.    Appearance: Normal appearance. She is well-developed. She is not ill-appearing, toxic-appearing or diaphoretic.  HENT:     Head: Normocephalic and atraumatic.     Right Ear: External ear normal.     Left Ear: External ear normal.     Nose: Nose normal.     Mouth/Throat:     Mouth: Mucous membranes are moist.  Eyes:     Extraocular Movements: Extraocular movements intact.     Conjunctiva/sclera: Conjunctivae normal.  Cardiovascular:     Rate and Rhythm: Normal rate and regular rhythm.  Pulmonary:     Effort: Pulmonary effort is normal. No respiratory distress.  Abdominal:     General: There is no distension.     Palpations: Abdomen is soft.     Tenderness: There is no abdominal tenderness.  Musculoskeletal:        General: No  swelling or deformity.     Cervical back: Normal range of motion and neck supple.     Right lower leg: No edema.     Left lower leg: No edema.  Skin:    General: Skin is warm and dry.     Coloration: Skin is not jaundiced or pale.  Neurological:     Mental Status: She is alert and oriented to person, place, and time.     Cranial Nerves: Cranial nerves 2-12 are intact. No cranial nerve deficit, dysarthria or facial asymmetry.     Sensory: Sensory deficit present.     Motor: Weakness present.     Comments: Plantar reflexes are intact.  Patient endorses equal and bilateral diminished sensation to lower extremities.  She has equal diminished right throughout BLE.  She denies diminished sensation to upper extremities.  Upper  extremity strength is intact.  Psychiatric:        Mood and Affect: Mood normal.        Behavior: Behavior normal.     ED Results / Procedures / Treatments   Labs (all labs ordered are listed, but only abnormal results are displayed) Labs Reviewed  COMPREHENSIVE METABOLIC PANEL - Abnormal; Notable for the following components:      Result Value   Calcium 8.8 (*)    Anion gap 1 (*)    All other components within normal limits  CBC WITH DIFFERENTIAL/PLATELET - Abnormal; Notable for the following components:   Hemoglobin 11.8 (*)    All other components within normal limits  URINALYSIS, ROUTINE W REFLEX MICROSCOPIC - Abnormal; Notable for the following components:   APPearance CLOUDY (*)    Hgb urine dipstick MODERATE (*)    Ketones, ur 5 (*)    Protein, ur 30 (*)    Leukocytes,Ua MODERATE (*)    Bacteria, UA MANY (*)    All other components within normal limits  URINE CULTURE  MAGNESIUM  PREGNANCY, URINE    EKG None  Radiology MR Brain W and Wo Contrast  Result Date: 01/11/2023 CLINICAL DATA:  Neuro deficit, acute, stroke suspected; Myelopathy, chronic, thoracic spine; Myelopathy, acute, lumbar spine; Myelopathy, acute, cervical spine EXAM: MRI HEAD WITHOUT AND WITH CONTRAST MRI CERVICAL SPINE WITHOUT AND WITH CONTRAST MRI THORACIC SPINE WITHOUT AND CONTRAST MRI LUMBAR SPINE WITHOUT AND CONTRAST CONTRAST:  10mL GADAVIST GADOBUTROL 1 MMOL/ML IV SOLN TECHNIQUE: Multiplanar, multiecho pulse sequences of the brain and surrounding structures, and cervical and thoracic and lumbar spine were obtained without and with intravenous contrast. COMPARISON:  None Available. FINDINGS: MRI HEAD FINDINGS Brain: Negative for an acute infarct. There are multiple periventricular and subcortical T2/FLAIR hyperintense lesions,including at the callososeptal interface. These are worrisome for demyelinating lesions. There is no evidence of true diffusion restriction or contrast enhancement to definitively  suggest active demyelination. T2 hyperintense lesions are also present in the pons. No hemorrhage. No extra-axial fluid collection. No hydrocephalus. Vascular: Normal flow voids. Skull and upper cervical spine: Normal marrow signal. Sinuses/Orbits: No middle ear or mastoid effusion. Paranasal sinuses are clear. Orbits are unremarkable. Note that assessment of the optic nerves is limited due to technique. Other: None. MRI CERVICAL SPINE FINDINGS Alignment: Physiologic. Vertebrae: No fracture, evidence of discitis, or bone lesion. Cord: The cord is normal in caliber. There is no convincing lesion in the cervical spine to definitively suggest demyelinating disease. No evidence of contrast enhancement to suggest active demyelination. Posterior Fossa, vertebral arteries, paraspinal tissues: Negative. Disc levels: No evidence of high-grade spinal canal stenosis.  MRI THORACIC SPINE FINDINGS Alignment:  Physiologic. Vertebrae: No fracture, evidence of discitis, or bone lesion. Cord: There is a T2 hyperintense lesion at the level of the T7-T8 disc space (series 19, image 10). This lesion likely demonstrates contrast enhancement (series 23, image 11). Paraspinal and other soft tissues: Negative. Disc levels: No evidence of high-grade spinal canal or neural foraminal stenosis. MRI LUMBAR SPINE FINDINGS Segmentation:  Standard. Alignment:  Physiologic. Vertebrae:  No fracture, evidence of discitis, or bone lesion. Conus medullaris and cauda equina: Conus extends to the L1-L2 disc space level. Conus and cauda equina appear normal. Paraspinal and other soft tissues: Negative. Disc levels: No evidence of high-grade spinal canal or neural foraminal stenosis. IMPRESSION: 1. Multiple periventricular and subcortical T2/FLAIR hyperintense lesions, including at the callososeptal interface. Findings are worrisome for demyelinating disease (Multiple sclerosis). No evidence of active demyelination in the brain. 2. T2 hyperintense lesion at  the level of the T7-T8 disc space, which likely demonstrates contrast enhancement. This is worrisome for an active demyelinating lesion. 3. No convincing lesion in the spine to definitively suggest demyelinating disease. 4. Normal appearance of the lumbar spine. Electronically Signed   By: Lorenza Cambridge M.D.   On: 01/11/2023 13:18   MR Cervical Spine W and Wo Contrast  Result Date: 01/11/2023 CLINICAL DATA:  Neuro deficit, acute, stroke suspected; Myelopathy, chronic, thoracic spine; Myelopathy, acute, lumbar spine; Myelopathy, acute, cervical spine EXAM: MRI HEAD WITHOUT AND WITH CONTRAST MRI CERVICAL SPINE WITHOUT AND WITH CONTRAST MRI THORACIC SPINE WITHOUT AND CONTRAST MRI LUMBAR SPINE WITHOUT AND CONTRAST CONTRAST:  10mL GADAVIST GADOBUTROL 1 MMOL/ML IV SOLN TECHNIQUE: Multiplanar, multiecho pulse sequences of the brain and surrounding structures, and cervical and thoracic and lumbar spine were obtained without and with intravenous contrast. COMPARISON:  None Available. FINDINGS: MRI HEAD FINDINGS Brain: Negative for an acute infarct. There are multiple periventricular and subcortical T2/FLAIR hyperintense lesions,including at the callososeptal interface. These are worrisome for demyelinating lesions. There is no evidence of true diffusion restriction or contrast enhancement to definitively suggest active demyelination. T2 hyperintense lesions are also present in the pons. No hemorrhage. No extra-axial fluid collection. No hydrocephalus. Vascular: Normal flow voids. Skull and upper cervical spine: Normal marrow signal. Sinuses/Orbits: No middle ear or mastoid effusion. Paranasal sinuses are clear. Orbits are unremarkable. Note that assessment of the optic nerves is limited due to technique. Other: None. MRI CERVICAL SPINE FINDINGS Alignment: Physiologic. Vertebrae: No fracture, evidence of discitis, or bone lesion. Cord: The cord is normal in caliber. There is no convincing lesion in the cervical spine to  definitively suggest demyelinating disease. No evidence of contrast enhancement to suggest active demyelination. Posterior Fossa, vertebral arteries, paraspinal tissues: Negative. Disc levels: No evidence of high-grade spinal canal stenosis. MRI THORACIC SPINE FINDINGS Alignment:  Physiologic. Vertebrae: No fracture, evidence of discitis, or bone lesion. Cord: There is a T2 hyperintense lesion at the level of the T7-T8 disc space (series 19, image 10). This lesion likely demonstrates contrast enhancement (series 23, image 11). Paraspinal and other soft tissues: Negative. Disc levels: No evidence of high-grade spinal canal or neural foraminal stenosis. MRI LUMBAR SPINE FINDINGS Segmentation:  Standard. Alignment:  Physiologic. Vertebrae:  No fracture, evidence of discitis, or bone lesion. Conus medullaris and cauda equina: Conus extends to the L1-L2 disc space level. Conus and cauda equina appear normal. Paraspinal and other soft tissues: Negative. Disc levels: No evidence of high-grade spinal canal or neural foraminal stenosis. IMPRESSION: 1. Multiple periventricular and subcortical T2/FLAIR hyperintense lesions, including  at the callososeptal interface. Findings are worrisome for demyelinating disease (Multiple sclerosis). No evidence of active demyelination in the brain. 2. T2 hyperintense lesion at the level of the T7-T8 disc space, which likely demonstrates contrast enhancement. This is worrisome for an active demyelinating lesion. 3. No convincing lesion in the spine to definitively suggest demyelinating disease. 4. Normal appearance of the lumbar spine. Electronically Signed   By: Lorenza Cambridge M.D.   On: 01/11/2023 13:18   MR THORACIC SPINE W WO CONTRAST  Result Date: 01/11/2023 CLINICAL DATA:  Neuro deficit, acute, stroke suspected; Myelopathy, chronic, thoracic spine; Myelopathy, acute, lumbar spine; Myelopathy, acute, cervical spine EXAM: MRI HEAD WITHOUT AND WITH CONTRAST MRI CERVICAL SPINE WITHOUT  AND WITH CONTRAST MRI THORACIC SPINE WITHOUT AND CONTRAST MRI LUMBAR SPINE WITHOUT AND CONTRAST CONTRAST:  10mL GADAVIST GADOBUTROL 1 MMOL/ML IV SOLN TECHNIQUE: Multiplanar, multiecho pulse sequences of the brain and surrounding structures, and cervical and thoracic and lumbar spine were obtained without and with intravenous contrast. COMPARISON:  None Available. FINDINGS: MRI HEAD FINDINGS Brain: Negative for an acute infarct. There are multiple periventricular and subcortical T2/FLAIR hyperintense lesions,including at the callososeptal interface. These are worrisome for demyelinating lesions. There is no evidence of true diffusion restriction or contrast enhancement to definitively suggest active demyelination. T2 hyperintense lesions are also present in the pons. No hemorrhage. No extra-axial fluid collection. No hydrocephalus. Vascular: Normal flow voids. Skull and upper cervical spine: Normal marrow signal. Sinuses/Orbits: No middle ear or mastoid effusion. Paranasal sinuses are clear. Orbits are unremarkable. Note that assessment of the optic nerves is limited due to technique. Other: None. MRI CERVICAL SPINE FINDINGS Alignment: Physiologic. Vertebrae: No fracture, evidence of discitis, or bone lesion. Cord: The cord is normal in caliber. There is no convincing lesion in the cervical spine to definitively suggest demyelinating disease. No evidence of contrast enhancement to suggest active demyelination. Posterior Fossa, vertebral arteries, paraspinal tissues: Negative. Disc levels: No evidence of high-grade spinal canal stenosis. MRI THORACIC SPINE FINDINGS Alignment:  Physiologic. Vertebrae: No fracture, evidence of discitis, or bone lesion. Cord: There is a T2 hyperintense lesion at the level of the T7-T8 disc space (series 19, image 10). This lesion likely demonstrates contrast enhancement (series 23, image 11). Paraspinal and other soft tissues: Negative. Disc levels: No evidence of high-grade spinal canal  or neural foraminal stenosis. MRI LUMBAR SPINE FINDINGS Segmentation:  Standard. Alignment:  Physiologic. Vertebrae:  No fracture, evidence of discitis, or bone lesion. Conus medullaris and cauda equina: Conus extends to the L1-L2 disc space level. Conus and cauda equina appear normal. Paraspinal and other soft tissues: Negative. Disc levels: No evidence of high-grade spinal canal or neural foraminal stenosis. IMPRESSION: 1. Multiple periventricular and subcortical T2/FLAIR hyperintense lesions, including at the callososeptal interface. Findings are worrisome for demyelinating disease (Multiple sclerosis). No evidence of active demyelination in the brain. 2. T2 hyperintense lesion at the level of the T7-T8 disc space, which likely demonstrates contrast enhancement. This is worrisome for an active demyelinating lesion. 3. No convincing lesion in the spine to definitively suggest demyelinating disease. 4. Normal appearance of the lumbar spine. Electronically Signed   By: Lorenza Cambridge M.D.   On: 01/11/2023 13:18   MR Lumbar Spine W Wo Contrast  Result Date: 01/11/2023 CLINICAL DATA:  Neuro deficit, acute, stroke suspected; Myelopathy, chronic, thoracic spine; Myelopathy, acute, lumbar spine; Myelopathy, acute, cervical spine EXAM: MRI HEAD WITHOUT AND WITH CONTRAST MRI CERVICAL SPINE WITHOUT AND WITH CONTRAST MRI THORACIC SPINE WITHOUT AND CONTRAST MRI  LUMBAR SPINE WITHOUT AND CONTRAST CONTRAST:  10mL GADAVIST GADOBUTROL 1 MMOL/ML IV SOLN TECHNIQUE: Multiplanar, multiecho pulse sequences of the brain and surrounding structures, and cervical and thoracic and lumbar spine were obtained without and with intravenous contrast. COMPARISON:  None Available. FINDINGS: MRI HEAD FINDINGS Brain: Negative for an acute infarct. There are multiple periventricular and subcortical T2/FLAIR hyperintense lesions,including at the callososeptal interface. These are worrisome for demyelinating lesions. There is no evidence of true  diffusion restriction or contrast enhancement to definitively suggest active demyelination. T2 hyperintense lesions are also present in the pons. No hemorrhage. No extra-axial fluid collection. No hydrocephalus. Vascular: Normal flow voids. Skull and upper cervical spine: Normal marrow signal. Sinuses/Orbits: No middle ear or mastoid effusion. Paranasal sinuses are clear. Orbits are unremarkable. Note that assessment of the optic nerves is limited due to technique. Other: None. MRI CERVICAL SPINE FINDINGS Alignment: Physiologic. Vertebrae: No fracture, evidence of discitis, or bone lesion. Cord: The cord is normal in caliber. There is no convincing lesion in the cervical spine to definitively suggest demyelinating disease. No evidence of contrast enhancement to suggest active demyelination. Posterior Fossa, vertebral arteries, paraspinal tissues: Negative. Disc levels: No evidence of high-grade spinal canal stenosis. MRI THORACIC SPINE FINDINGS Alignment:  Physiologic. Vertebrae: No fracture, evidence of discitis, or bone lesion. Cord: There is a T2 hyperintense lesion at the level of the T7-T8 disc space (series 19, image 10). This lesion likely demonstrates contrast enhancement (series 23, image 11). Paraspinal and other soft tissues: Negative. Disc levels: No evidence of high-grade spinal canal or neural foraminal stenosis. MRI LUMBAR SPINE FINDINGS Segmentation:  Standard. Alignment:  Physiologic. Vertebrae:  No fracture, evidence of discitis, or bone lesion. Conus medullaris and cauda equina: Conus extends to the L1-L2 disc space level. Conus and cauda equina appear normal. Paraspinal and other soft tissues: Negative. Disc levels: No evidence of high-grade spinal canal or neural foraminal stenosis. IMPRESSION: 1. Multiple periventricular and subcortical T2/FLAIR hyperintense lesions, including at the callososeptal interface. Findings are worrisome for demyelinating disease (Multiple sclerosis). No evidence of  active demyelination in the brain. 2. T2 hyperintense lesion at the level of the T7-T8 disc space, which likely demonstrates contrast enhancement. This is worrisome for an active demyelinating lesion. 3. No convincing lesion in the spine to definitively suggest demyelinating disease. 4. Normal appearance of the lumbar spine. Electronically Signed   By: Lorenza Cambridge M.D.   On: 01/11/2023 13:18    Procedures Procedures    Medications Ordered in ED Medications  methylPREDNISolone sodium succinate (SOLU-MEDROL) 1,000 mg in sodium chloride 0.9 % 50 mL IVPB (has no administration in time range)    And  pantoprazole (PROTONIX) injection 40 mg (has no administration in time range)  cefTRIAXone (ROCEPHIN) 2 g in sodium chloride 0.9 % 100 mL IVPB (has no administration in time range)  gadobutrol (GADAVIST) 1 MMOL/ML injection 10 mL (10 mLs Intravenous Contrast Given 01/11/23 1210)    ED Course/ Medical Decision Making/ A&P                             Medical Decision Making Amount and/or Complexity of Data Reviewed Labs: ordered. Radiology: ordered.  Risk Prescription drug management.   This patient presents to the ED for concern of lower extremity numbness and weakness, this involves an extensive number of treatment options, and is a complaint that carries with it a high risk of complications and morbidity.  The differential diagnosis includes MS,  epidural abscess, spinal infarction, transverse myelitis, Guillain-Barr syndrome, neuropathy, conversion disorder   Co morbidities that complicate the patient evaluation  HLD, depression, asthma   Additional history obtained:  Additional history obtained from patient's mother External records from outside source obtained and reviewed including EMR   Lab Tests:  I Ordered, and personally interpreted labs.  The pertinent results include: Baseline anemia, no leukocytosis, normal electrolytes, urinalysis shows bacteria, pyuria, and  hematuria   Imaging Studies ordered:  I ordered imaging studies including MRI of entire neuraxis I independently visualized and interpreted imaging which showed multiple hyperintense lesions worrisome for demyelinating disease I agree with the radiologist interpretation   Cardiac Monitoring: / EKG:  The patient was maintained on a cardiac monitor.  I personally viewed and interpreted the cardiac monitored which showed an underlying rhythm of: Sinus rhythm   Consultations Obtained:  I requested consultation with the neurologist, Dr. Selina Cooley,  and discussed lab and imaging findings as well as pertinent plan - they recommend: Admission to [facility]   Problem List / ED Course / Critical interventions / Medication management  Patient presents for progressive lower extremity weakness and numbness.  Onset of symptoms was 4 days ago.  It started as numbness in her feet.  It is since progressed to numbness throughout her lower extremities and abdomen.  She also endorses weakness and difficulty walking.  Despite this, she was able to stand throughout her shift overnight.  In ED triage, she denied any sensation to her lower legs.  On my exam, she is able to sense light touch on both lower extremities, however, she states that sensation is diminished.  When assessing strength, she does appear to have equal 4/5 strength in both lower extremities.  Plantar reflexes are intact.  Despite what she describes as very concerning symptoms, patient does appear aloof and jovial.  Workup was initiated.  Serum lab work was unremarkable.  Patient underwent MRIs of entire neuraxis.  This did show findings consistent with demyelinating process.  I spoke with neurologist on-call, Dr. Selina Cooley, who will order high-dose steroids.  She does request admission at [facility] where neurology team can evaluate her daily.  Urinalysis shows evidence of UTI.  When questioned at recent urine symptoms, patient does endorse some recent  frequency and dysuria.  UTI may be what has flared up her symptoms.  Antibiotics were ordered.  Patient was admitted to medicine for further management. I ordered medication including ceftriaxone for UTI Reevaluation of the patient after these medicines showed that the patient stayed the same I have reviewed the patients home medicines and have made adjustments as needed   Social Determinants of Health:  Does not have PCP        Final Clinical Impression(s) / ED Diagnoses Final diagnoses:  Numbness  Weakness  Cystitis    Rx / DC Orders ED Discharge Orders     None         Gloris Manchester, MD 01/11/23 1405

## 2023-01-11 NOTE — Plan of Care (Signed)

## 2023-01-11 NOTE — H&P (Signed)
History and Physical    Dana Woods UJW:[id number] DOB: 11-18-2000 DOA: 01/11/2023  PCP: Pcp, No   Patient coming from: Home  I have personally briefly reviewed patient's old medical records in [facility]  Chief Complaint:  RLE Numbness / Weakness   HPI: Dana Woods is a 22 y.o. female with PMH significant for hyperlipidemia, major depression,  asthma presented in the ED with complaints of right lower extremity numbness and weakness for last 4 days.  Patient reports she was in her usual state of health on Saturday, on Sunday she started to experience numbness in the right leg which has radiated upwards towards the pelvis and suprapubic region.  Now she has developed weakness and was having difficulty walking.  She works in a [employer] and she works in the third shift.  She stands for long hours during her shift.  As soon as she completed her work,  She came to the ED with her mother.  Patient now reports persistent numbness but reports weakness has improved.  She also reports urinary discomfort and frequency, denies any blood in the urine.  She denies any history of any demyelinating disease in the family.  ED Course: She is hemodynamically stable except hypertension and bradycardia. HR 52, BP 140/120, RR 18, temp 98.4, SpO2 100% on room air. Labs include sodium 136, potassium 3.8, chloride 110, bicarb 25, glucose 81, BUN 15, creatinine 0.86, calcium 8.8, anion gap 1, magnesium 2.2, alkaline phosphatase 79, albumin 3.9, lipase 31, AST 15, ALT 14, total protein 7.7, total bilirubin 0.6, WBC 7.5, hemoglobin 11.8, hematocrit 37.9, MCV 89.4, platelet 290, pregnancy test negative, UA : Cloudy, ketones 5, leukocytes moderate, protein study, bacteria man.  MRI Brain, C spine, T spine and L spine: Multiple periventricular and subcortical T2/FLAIR hyperintense lesions, including at the callososeptal interface. Findings are worrisome for demyelinating disease (Multiple sclerosis). No  evidence of active demyelination in the brain. T2 hyperintense lesion at the level of the T7-T8 disc space, which likely demonstrates contrast enhancement. This is worrisome for an active demyelinating lesion. No convincing lesion in the spine to definitively suggest demyelinating disease. Normal appearance of the lumbar spine.  Review of Systems:    Review of Systems  Constitutional: Negative.   HENT: Negative.    Eyes: Negative.   Respiratory: Negative.    Cardiovascular: Negative.   Gastrointestinal: Negative.   Genitourinary:  Positive for dysuria and frequency.  Musculoskeletal: Negative.   Skin: Negative.   Neurological:  Positive for weakness.       Right lower extremity weakness  Endo/Heme/Allergies: Negative.   Psychiatric/Behavioral:  Positive for depression. The patient is nervous/anxious.     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 She has never used smokeless tobacco. She reports that she does not currently use alcohol. She reports that she does not use drugs.  Allergies  Allergen Reactions   Sabri.Abo [  Gadobutrol] Nausea Only    Nausea right after contrast was given. No other reaction.    Nickel Other (See Comments)    Family History  Problem Relation Age of Onset   ADD / ADHD Sister    Family history reviewed and not pertinent.  Prior to Admission medications   Medication Sig Start Date End Date Taking? Authorizing Provider   Norethindrone Acetate-Ethinyl Estrad-FE (LOESTRIN 24 FE) 1-20 MG-MCG(24) tablet Take 1 tablet by mouth daily. Patient not taking: Reported on 01/11/2023 03/10/21   Tereso Newcomer, MD  oxyCODONE (OXY IR/ROXICODONE) 5 MG immediate release tablet Take 1 tablet (5 mg total) by mouth every 6 (six) hours as needed for severe pain. Patient not taking: Reported on 01/11/2023 10/18/22   Carlena Bjornstad A, PA-C  polyethylene glycol (MIRALAX) 17 g packet Take 17 g by mouth daily. Patient not taking: Reported on 01/11/2023 10/18/22   Franne Forts, PA-C  FLUoxetine (PROZAC) 20 MG capsule Take 1 capsule (20 mg total) by mouth daily. 02/08/19 07/13/19  Theadore Nan, MD  ipratropium (ATROVENT) 0.06 % nasal spray Place 2 sprays into both nostrils 3 (three) times daily. 11/02/17 07/13/19  Georgetta Haber, NP    Physical Exam: Vitals:   01/11/23 0930 01/11/23 1030 01/11/23 1301 01/11/23 1330  BP: (!) 141/89 (!) 152/87 127/74 (!) 140/120  Pulse: 66 62 65 (!) 52  Resp: 18 18 18 18   Temp:   98.4 F (36.9 C)   TempSrc:   Oral   SpO2: 100% 98% 100% 100%  Weight:        Constitutional: Appears comfortable, anxious, not in any acute distress Vitals:   01/11/23 0930 01/11/23 1030 01/11/23 1301 01/11/23 1330  BP: (!) 141/89 (!) 152/87 127/74 (!) 140/120  Pulse: 66 62 65 (!) 52  Resp: 18 18 18 18   Temp:   98.4 F (36.9 C)   TempSrc:   Oral   SpO2: 100% 98% 100% 100%  Weight:       Eyes: PERRL, lids and conjunctivae normal ENMT: Mucous membranes are moist.  Posterior pharynx without exudate. Neck: normal, supple, no masses, no thyromegaly Respiratory: CTA bilaterally. Normal respiratory effort. No accessory muscle use.  Cardiovascular: S1-S2 heard, regular rate and rhythm, no murmur. Abdomen: Abdomen is soft, non tender, non distended, BS+ Musculoskeletal: No joint deformity upper and lower extremities.  Good ROM, no contractures. Normal muscle tone.  Skin: no rashes, lesions, ulcers. No  induration Neurologic: CN 2-12 grossly intact.  Decreased sensation RLE, Strength 5/5 in all 4.  Psychiatric: Normal judgment and insight. Alert and oriented x 3. Normal mood.    Labs on Admission: I have personally reviewed following labs and imaging studies  CBC: Recent Labs  Lab 01/11/23 0826  WBC 7.5  NEUTROABS 4.6  HGB 11.8*  HCT 37.9  MCV 89.4  PLT 290   Basic Metabolic Panel: Recent Labs  Lab 01/11/23 0826  NA 136  K 3.8  CL 110  CO2 25  GLUCOSE 81  BUN 15  CREATININE 0.86  CALCIUM 8.8*  MG 2.2   GFR: Estimated Creatinine Clearance: 138.8 mL/min (by C-G formula based on SCr of 0.86 mg/dL). Liver Function Tests: Recent Labs  Lab 01/11/23 0826  AST 15  ALT 14  ALKPHOS 79  BILITOT 0.6  PROT 7.7  ALBUMIN 3.9   No results for input(s): "LIPASE", "AMYLASE" in the last 168 hours. No results for input(s): "AMMONIA" in the last 168 hours. Coagulation Profile: No results for input(s): "INR", "  PROTIME" in the last 168 hours. Cardiac Enzymes: No results for input(s): "CKTOTAL", "CKMB", "CKMBINDEX", "TROPONINI" in the last 168 hours. BNP (last 3 results) No results for input(s): "PROBNP" in the last 8760 hours. HbA1C: No results for input(s): "HGBA1C" in the last 72 hours. CBG: No results for input(s): "GLUCAP" in the last 168 hours. Lipid Profile: No results for input(s): "CHOL", "HDL", "LDLCALC", "TRIG", "CHOLHDL", "LDLDIRECT" in the last 72 hours. Thyroid Function Tests: No results for input(s): "TSH", "T4TOTAL", "FREET4", "T3FREE", "THYROIDAB" in the last 72 hours. Anemia Panel: No results for input(s): "VITAMINB12", "FOLATE", "FERRITIN", "TIBC", "IRON", "RETICCTPCT" in the last 72 hours. Urine analysis:    Component Value Date/Time   COLORURINE YELLOW 01/11/2023 0847   APPEARANCEUR CLOUDY (A) 01/11/2023 0847   LABSPEC 1.028 01/11/2023 0847   PHURINE 6.0 01/11/2023 0847   GLUCOSEU NEGATIVE 01/11/2023 0847   HGBUR MODERATE (A) 01/11/2023 0847    BILIRUBINUR NEGATIVE 01/11/2023 0847   BILIRUBINUR neg 03/20/2013 1041   KETONESUR 5 (A) 01/11/2023 0847   PROTEINUR 30 (A) 01/11/2023 0847   UROBILINOGEN 0.2 07/13/2019 1200   NITRITE NEGATIVE 01/11/2023 0847   LEUKOCYTESUR MODERATE (A) 01/11/2023 0847    Radiological Exams on Admission: MR Brain W and Wo Contrast  Result Date: 01/11/2023 CLINICAL DATA:  Neuro deficit, acute, stroke suspected; Myelopathy, chronic, thoracic spine; Myelopathy, acute, lumbar spine; Myelopathy, acute, cervical spine EXAM: MRI HEAD WITHOUT AND WITH CONTRAST MRI CERVICAL SPINE WITHOUT AND WITH CONTRAST MRI THORACIC SPINE WITHOUT AND CONTRAST MRI LUMBAR SPINE WITHOUT AND CONTRAST CONTRAST:  10mL GADAVIST GADOBUTROL 1 MMOL/ML IV SOLN TECHNIQUE: Multiplanar, multiecho pulse sequences of the brain and surrounding structures, and cervical and thoracic and lumbar spine were obtained without and with intravenous contrast. COMPARISON:  None Available. FINDINGS: MRI HEAD FINDINGS Brain: Negative for an acute infarct. There are multiple periventricular and subcortical T2/FLAIR hyperintense lesions,including at the callososeptal interface. These are worrisome for demyelinating lesions. There is no evidence of true diffusion restriction or contrast enhancement to definitively suggest active demyelination. T2 hyperintense lesions are also present in the pons. No hemorrhage. No extra-axial fluid collection. No hydrocephalus. Vascular: Normal flow voids. Skull and upper cervical spine: Normal marrow signal. Sinuses/Orbits: No middle ear or mastoid effusion. Paranasal sinuses are clear. Orbits are unremarkable. Note that assessment of the optic nerves is limited due to technique. Other: None. MRI CERVICAL SPINE FINDINGS Alignment: Physiologic. Vertebrae: No fracture, evidence of discitis, or bone lesion. Cord: The cord is normal in caliber. There is no convincing lesion in the cervical spine to definitively suggest demyelinating disease. No  evidence of contrast enhancement to suggest active demyelination. Posterior Fossa, vertebral arteries, paraspinal tissues: Negative. Disc levels: No evidence of high-grade spinal canal stenosis. MRI THORACIC SPINE FINDINGS Alignment:  Physiologic. Vertebrae: No fracture, evidence of discitis, or bone lesion. Cord: There is a T2 hyperintense lesion at the level of the T7-T8 disc space (series 19, image 10). This lesion likely demonstrates contrast enhancement (series 23, image 11). Paraspinal and other soft tissues: Negative. Disc levels: No evidence of high-grade spinal canal or neural foraminal stenosis. MRI LUMBAR SPINE FINDINGS Segmentation:  Standard. Alignment:  Physiologic. Vertebrae:  No fracture, evidence of discitis, or bone lesion. Conus medullaris and cauda equina: Conus extends to the L1-L2 disc space level. Conus and cauda equina appear normal. Paraspinal and other soft tissues: Negative. Disc levels: No evidence of high-grade spinal canal or neural foraminal stenosis. IMPRESSION: 1. Multiple periventricular and subcortical T2/FLAIR hyperintense lesions, including at the callososeptal interface. Findings  are worrisome for demyelinating disease (Multiple sclerosis). No evidence of active demyelination in the brain. 2. T2 hyperintense lesion at the level of the T7-T8 disc space, which likely demonstrates contrast enhancement. This is worrisome for an active demyelinating lesion. 3. No convincing lesion in the spine to definitively suggest demyelinating disease. 4. Normal appearance of the lumbar spine. Electronically Signed   By: Lorenza Cambridge M.D.   On: 01/11/2023 13:18   MR Cervical Spine W and Wo Contrast  Result Date: 01/11/2023 CLINICAL DATA:  Neuro deficit, acute, stroke suspected; Myelopathy, chronic, thoracic spine; Myelopathy, acute, lumbar spine; Myelopathy, acute, cervical spine EXAM: MRI HEAD WITHOUT AND WITH CONTRAST MRI CERVICAL SPINE WITHOUT AND WITH CONTRAST MRI THORACIC SPINE WITHOUT  AND CONTRAST MRI LUMBAR SPINE WITHOUT AND CONTRAST CONTRAST:  10mL GADAVIST GADOBUTROL 1 MMOL/ML IV SOLN TECHNIQUE: Multiplanar, multiecho pulse sequences of the brain and surrounding structures, and cervical and thoracic and lumbar spine were obtained without and with intravenous contrast. COMPARISON:  None Available. FINDINGS: MRI HEAD FINDINGS Brain: Negative for an acute infarct. There are multiple periventricular and subcortical T2/FLAIR hyperintense lesions,including at the callososeptal interface. These are worrisome for demyelinating lesions. There is no evidence of true diffusion restriction or contrast enhancement to definitively suggest active demyelination. T2 hyperintense lesions are also present in the pons. No hemorrhage. No extra-axial fluid collection. No hydrocephalus. Vascular: Normal flow voids. Skull and upper cervical spine: Normal marrow signal. Sinuses/Orbits: No middle ear or mastoid effusion. Paranasal sinuses are clear. Orbits are unremarkable. Note that assessment of the optic nerves is limited due to technique. Other: None. MRI CERVICAL SPINE FINDINGS Alignment: Physiologic. Vertebrae: No fracture, evidence of discitis, or bone lesion. Cord: The cord is normal in caliber. There is no convincing lesion in the cervical spine to definitively suggest demyelinating disease. No evidence of contrast enhancement to suggest active demyelination. Posterior Fossa, vertebral arteries, paraspinal tissues: Negative. Disc levels: No evidence of high-grade spinal canal stenosis. MRI THORACIC SPINE FINDINGS Alignment:  Physiologic. Vertebrae: No fracture, evidence of discitis, or bone lesion. Cord: There is a T2 hyperintense lesion at the level of the T7-T8 disc space (series 19, image 10). This lesion likely demonstrates contrast enhancement (series 23, image 11). Paraspinal and other soft tissues: Negative. Disc levels: No evidence of high-grade spinal canal or neural foraminal stenosis. MRI LUMBAR  SPINE FINDINGS Segmentation:  Standard. Alignment:  Physiologic. Vertebrae:  No fracture, evidence of discitis, or bone lesion. Conus medullaris and cauda equina: Conus extends to the L1-L2 disc space level. Conus and cauda equina appear normal. Paraspinal and other soft tissues: Negative. Disc levels: No evidence of high-grade spinal canal or neural foraminal stenosis. IMPRESSION: 1. Multiple periventricular and subcortical T2/FLAIR hyperintense lesions, including at the callososeptal interface. Findings are worrisome for demyelinating disease (Multiple sclerosis). No evidence of active demyelination in the brain. 2. T2 hyperintense lesion at the level of the T7-T8 disc space, which likely demonstrates contrast enhancement. This is worrisome for an active demyelinating lesion. 3. No convincing lesion in the spine to definitively suggest demyelinating disease. 4. Normal appearance of the lumbar spine. Electronically Signed   By: Lorenza Cambridge M.D.   On: 01/11/2023 13:18   MR THORACIC SPINE W WO CONTRAST  Result Date: 01/11/2023 CLINICAL DATA:  Neuro deficit, acute, stroke suspected; Myelopathy, chronic, thoracic spine; Myelopathy, acute, lumbar spine; Myelopathy, acute, cervical spine EXAM: MRI HEAD WITHOUT AND WITH CONTRAST MRI CERVICAL SPINE WITHOUT AND WITH CONTRAST MRI THORACIC SPINE WITHOUT AND CONTRAST MRI LUMBAR SPINE WITHOUT AND  CONTRAST CONTRAST:  10mL GADAVIST GADOBUTROL 1 MMOL/ML IV SOLN TECHNIQUE: Multiplanar, multiecho pulse sequences of the brain and surrounding structures, and cervical and thoracic and lumbar spine were obtained without and with intravenous contrast. COMPARISON:  None Available. FINDINGS: MRI HEAD FINDINGS Brain: Negative for an acute infarct. There are multiple periventricular and subcortical T2/FLAIR hyperintense lesions,including at the callososeptal interface. These are worrisome for demyelinating lesions. There is no evidence of true diffusion restriction or contrast  enhancement to definitively suggest active demyelination. T2 hyperintense lesions are also present in the pons. No hemorrhage. No extra-axial fluid collection. No hydrocephalus. Vascular: Normal flow voids. Skull and upper cervical spine: Normal marrow signal. Sinuses/Orbits: No middle ear or mastoid effusion. Paranasal sinuses are clear. Orbits are unremarkable. Note that assessment of the optic nerves is limited due to technique. Other: None. MRI CERVICAL SPINE FINDINGS Alignment: Physiologic. Vertebrae: No fracture, evidence of discitis, or bone lesion. Cord: The cord is normal in caliber. There is no convincing lesion in the cervical spine to definitively suggest demyelinating disease. No evidence of contrast enhancement to suggest active demyelination. Posterior Fossa, vertebral arteries, paraspinal tissues: Negative. Disc levels: No evidence of high-grade spinal canal stenosis. MRI THORACIC SPINE FINDINGS Alignment:  Physiologic. Vertebrae: No fracture, evidence of discitis, or bone lesion. Cord: There is a T2 hyperintense lesion at the level of the T7-T8 disc space (series 19, image 10). This lesion likely demonstrates contrast enhancement (series 23, image 11). Paraspinal and other soft tissues: Negative. Disc levels: No evidence of high-grade spinal canal or neural foraminal stenosis. MRI LUMBAR SPINE FINDINGS Segmentation:  Standard. Alignment:  Physiologic. Vertebrae:  No fracture, evidence of discitis, or bone lesion. Conus medullaris and cauda equina: Conus extends to the L1-L2 disc space level. Conus and cauda equina appear normal. Paraspinal and other soft tissues: Negative. Disc levels: No evidence of high-grade spinal canal or neural foraminal stenosis. IMPRESSION: 1. Multiple periventricular and subcortical T2/FLAIR hyperintense lesions, including at the callososeptal interface. Findings are worrisome for demyelinating disease (Multiple sclerosis). No evidence of active demyelination in the brain.  2. T2 hyperintense lesion at the level of the T7-T8 disc space, which likely demonstrates contrast enhancement. This is worrisome for an active demyelinating lesion. 3. No convincing lesion in the spine to definitively suggest demyelinating disease. 4. Normal appearance of the lumbar spine. Electronically Signed   By: Lorenza Cambridge M.D.   On: 01/11/2023 13:18   MR Lumbar Spine W Wo Contrast  Result Date: 01/11/2023 CLINICAL DATA:  Neuro deficit, acute, stroke suspected; Myelopathy, chronic, thoracic spine; Myelopathy, acute, lumbar spine; Myelopathy, acute, cervical spine EXAM: MRI HEAD WITHOUT AND WITH CONTRAST MRI CERVICAL SPINE WITHOUT AND WITH CONTRAST MRI THORACIC SPINE WITHOUT AND CONTRAST MRI LUMBAR SPINE WITHOUT AND CONTRAST CONTRAST:  10mL GADAVIST GADOBUTROL 1 MMOL/ML IV SOLN TECHNIQUE: Multiplanar, multiecho pulse sequences of the brain and surrounding structures, and cervical and thoracic and lumbar spine were obtained without and with intravenous contrast. COMPARISON:  None Available. FINDINGS: MRI HEAD FINDINGS Brain: Negative for an acute infarct. There are multiple periventricular and subcortical T2/FLAIR hyperintense lesions,including at the callososeptal interface. These are worrisome for demyelinating lesions. There is no evidence of true diffusion restriction or contrast enhancement to definitively suggest active demyelination. T2 hyperintense lesions are also present in the pons. No hemorrhage. No extra-axial fluid collection. No hydrocephalus. Vascular: Normal flow voids. Skull and upper cervical spine: Normal marrow signal. Sinuses/Orbits: No middle ear or mastoid effusion. Paranasal sinuses are clear. Orbits are unremarkable. Note that assessment of  the optic nerves is limited due to technique. Other: None. MRI CERVICAL SPINE FINDINGS Alignment: Physiologic. Vertebrae: No fracture, evidence of discitis, or bone lesion. Cord: The cord is normal in caliber. There is no convincing lesion in  the cervical spine to definitively suggest demyelinating disease. No evidence of contrast enhancement to suggest active demyelination. Posterior Fossa, vertebral arteries, paraspinal tissues: Negative. Disc levels: No evidence of high-grade spinal canal stenosis. MRI THORACIC SPINE FINDINGS Alignment:  Physiologic. Vertebrae: No fracture, evidence of discitis, or bone lesion. Cord: There is a T2 hyperintense lesion at the level of the T7-T8 disc space (series 19, image 10). This lesion likely demonstrates contrast enhancement (series 23, image 11). Paraspinal and other soft tissues: Negative. Disc levels: No evidence of high-grade spinal canal or neural foraminal stenosis. MRI LUMBAR SPINE FINDINGS Segmentation:  Standard. Alignment:  Physiologic. Vertebrae:  No fracture, evidence of discitis, or bone lesion. Conus medullaris and cauda equina: Conus extends to the L1-L2 disc space level. Conus and cauda equina appear normal. Paraspinal and other soft tissues: Negative. Disc levels: No evidence of high-grade spinal canal or neural foraminal stenosis. IMPRESSION: 1. Multiple periventricular and subcortical T2/FLAIR hyperintense lesions, including at the callososeptal interface. Findings are worrisome for demyelinating disease (Multiple sclerosis). No evidence of active demyelination in the brain. 2. T2 hyperintense lesion at the level of the T7-T8 disc space, which likely demonstrates contrast enhancement. This is worrisome for an active demyelinating lesion. 3. No convincing lesion in the spine to definitively suggest demyelinating disease. 4. Normal appearance of the lumbar spine. Electronically Signed   By: Lorenza Cambridge M.D.   On: 01/11/2023 13:18    EKG: Please check . 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 It started in her feet and progressed upwards to involve pelvic and suprapubic region. MRI showed an active demyelinating lesion at T7-T8 disc space. ED physician has discussed the case with neurologist Dr. Bing Neighbors. She recommended admission to [facility] and high-dose steroids for 5 days. Continue Solu-Medrol for 5 days, Continue Protonix 40 mg daily PT and OT evaluation.  UTI: Patient reports having dysuria and increased frequency. UA consistent with UTI. Continue ceftriaxone 1 g daily for 5 days Follow-up urine culture.  Hyperlipidemia: Patient reports not taking any medications. Obtain lipid profile   Major depression: Patient reports not taking any antidepressant medication. She denies any suicidal/ homicidal ideations.  Asthma: Stable, not in any acute exacerbation.  Obesity: Diet and exercise discussed in detail.  Estimated body mass index is 45.19 kg/m as calculated from the following:   Height as of 10/18/22: 5\' 6"  (1.676 m).   Weight as of this encounter: 127 kg.    DVT prophylaxis: Lovenox Code Status: Full code Family Communication: Mother at bed side Disposition Plan:   Status is: Inpatient Remains inpatient appropriate because: Admitted for acute neurological event consistent with MS attack. Patient is admitted at [facility],  Neurology is consulted.    Consults called: Neurologist ( Dr. Selina Cooley) Admission status: Inpatient   Willeen Niece MD [facility]  If 7PM-7AM, please contact night-coverage   01/11/2023, 3:38 PM

## 2023-01-11 NOTE — ED Notes (Signed)
ED TO INPATIENT HANDOFF REPORT  ED Nurse Name and Phone #: Lona Kettle Name/Age/Gender Dana Woods 22 y.o. female Room/Bed: WA03/WA03  Code Status   Code Status: Full Code  Home/SNF/Other Home Patient oriented to: self, place, time, and situation Is this baseline? Yes   Triage Complete: Triage complete  Chief Complaint Multiple sclerosis with acute neurologic event (HCC) [G35]  Triage Note C/o numbness to bilateral toes that started 3 days ago.  Numbness now radiates to torso.  Pt denies being able to feel me touching her legs and feet in triage.  Pt ambulatory to triage.    Allergies Allergies  Allergen Reactions   Gadavist [Gadobutrol] Nausea Only    Nausea right after contrast was given. No other reaction.    Nickel Other (See Comments)    Level of Care/Admitting Diagnosis ED Disposition     ED Disposition  Admit   Condition  --   Comment  Hospital Area: [facility] [[id number]]  Level of Care: Telemetry Medical [104]  May admit patient to [facility] or [facility] if equivalent level of care is available:: Yes  Covid Evaluation: Asymptomatic - no recent exposure (last 10 days) testing not required  Diagnosis: Multiple sclerosis with acute neurologic event [facility]) [[id number]]  Admitting Physician: Chevis Pretty  Attending Physician: Chevis Pretty  Certification:: I certify this patient will need inpatient services for at least 2 midnights  Estimated Length of Stay: 3          B Medical/Surgery History Past Medical History:  Diagnosis Date   Asthma    At risk for diabetes mellitus 12/2009   HbA1c 5.7   Hypercholesteremia 12/2009   LDL 116, totat 181   Irregular menses    Myopia of both eyes 12/2011   has glasses   Obesity 2009   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   Past Surgical History:  Procedure Laterality Date   CHOLECYSTECTOMY Bilateral 10/18/2022   Procedure: LAPAROSCOPIC CHOLECYSTECTOMY WITH INTRAOPERATIVE  CHOLANGIOGRAM;  Surgeon: Gaynelle Adu, MD;  Location: [facility];  Service: General;  Laterality: Bilateral;     A IV Location/Drains/Wounds Patient Lines/Drains/Airways Status     Active Line/Drains/Airways     Name Placement date Placement time Site Days   Peripheral IV 01/11/23 20 G 1.88" Right Antecubital 01/11/23  0843  Antecubital  less than 1   Incision - 4 Ports Abdomen 1: Umbilicus 2: Upper 3: Right;Medial 4: Right;Lateral 10/18/22  1458  -- 85            Intake/Output Last 24 hours No intake or output data in the 24 hours ending 01/11/23 1455  Labs/Imaging Results for orders placed or performed during the hospital encounter of 01/11/23 (from the past 48 hour(s))  Comprehensive metabolic panel     Status: Abnormal   Collection Time: 01/11/23  8:26 AM  Result Value Ref Range   Sodium 136 135 - 145 mmol/L   Potassium 3.8 3.5 - 5.1 mmol/L   Chloride 110 98 - 111 mmol/L   CO2 25 22 - 32 mmol/L   Glucose, Bld 81 70 - 99 mg/dL    Comment: Glucose reference range applies only to samples taken after fasting for at least 8 hours.   BUN 15 6 - 20 mg/dL   Creatinine, Ser 4.54 0.44 - 1.00 mg/dL   Calcium 8.8 (L) 8.9 - 10.3 mg/dL   Total Protein 7.7 6.5 - 8.1 g/dL   Albumin 3.9 3.5 - 5.0  g/dL   AST 15 15 - 41 U/L   ALT 14 0 - 44 U/L   Alkaline Phosphatase 79 38 - 126 U/L   Total Bilirubin 0.6 0.3 - 1.2 mg/dL   GFR, Estimated >78 >29 mL/min    Comment: (NOTE) Calculated using the CKD-EPI Creatinine Equation (2021)    Anion gap 1 (L) 5 - 15    Comment: Performed at [facility], [address] [place], [place] [address]  Magnesium     Status: None   Collection Time: 01/11/23  8:26 AM  Result Value Ref Range   Magnesium 2.2 1.7 - 2.4 mg/dL    Comment: Performed at [facility], [address], [place], [place] [address]  CBC with Differential     Status: Abnormal   Collection Time: 01/11/23  8:26 AM  Result Value Ref Range   WBC 7.5  4.0 - 10.5 K/uL   RBC 4.24 3.87 - 5.11 MIL/uL   Hemoglobin 11.8 (L) 12.0 - 15.0 g/dL   HCT 84.6 96.2 - 95.2 %   MCV 89.4 80.0 - 100.0 fL   MCH 27.8 26.0 - 34.0 pg   MCHC 31.1 30.0 - 36.0 g/dL   RDW 84.1 32.4 - 40.1 %   Platelets 290 150 - 400 K/uL   nRBC 0.0 0.0 - 0.2 %   Neutrophils Relative % 61 %   Neutro Abs 4.6 1.7 - 7.7 K/uL   Lymphocytes Relative 31 %   Lymphs Abs 2.3 0.7 - 4.0 K/uL   Monocytes Relative 5 %   Monocytes Absolute 0.4 0.1 - 1.0 K/uL   Eosinophils Relative 2 %   Eosinophils Absolute 0.2 0.0 - 0.5 K/uL   Basophils Relative 1 %   Basophils Absolute 0.1 0.0 - 0.1 K/uL   Immature Granulocytes 0 %   Abs Immature Granulocytes 0.01 0.00 - 0.07 K/uL    Comment: Performed at [facility], [address], [place], [place] [address]  Pregnancy, urine     Status: None   Collection Time: 01/11/23  8:47 AM  Result Value Ref Range   Preg Test, Ur NEGATIVE NEGATIVE    Comment: Performed at [facility], [address], [place], [place] [address]  Urinalysis, Routine w reflex microscopic -     Status: Abnormal   Collection Time: 01/11/23  8:47 AM  Result Value Ref Range   Color, Urine YELLOW YELLOW   APPearance CLOUDY (A) CLEAR   Specific Gravity, Urine 1.028 1.005 - 1.030   pH 6.0 5.0 - 8.0   Glucose, UA NEGATIVE NEGATIVE mg/dL   Hgb urine dipstick MODERATE (A) NEGATIVE   Bilirubin Urine NEGATIVE NEGATIVE   Ketones, ur 5 (A) NEGATIVE mg/dL   Protein, ur 30 (A) NEGATIVE mg/dL   Nitrite NEGATIVE NEGATIVE   Leukocytes,Ua MODERATE (A) NEGATIVE   RBC / HPF 21-50 0 - 5 RBC/hpf   WBC, UA >50 0 - 5 WBC/hpf   Bacteria, UA MANY (A) NONE SEEN   Squamous Epithelial / HPF 21-50 0 - 5 /HPF   Mucus PRESENT     Comment: Performed at [facility], [address] [place], [place] [address]   MR Brain W and Wo Contrast  Result Date: 01/11/2023 CLINICAL DATA:  Neuro deficit, acute, stroke suspected; Myelopathy, chronic,  thoracic spine; Myelopathy, acute, lumbar spine; Myelopathy, acute, cervical spine EXAM: MRI HEAD WITHOUT AND WITH CONTRAST MRI CERVICAL SPINE WITHOUT AND WITH CONTRAST MRI THORACIC SPINE WITHOUT  AND CONTRAST MRI LUMBAR SPINE WITHOUT AND CONTRAST CONTRAST:  10mL GADAVIST GADOBUTROL 1 MMOL/ML IV SOLN TECHNIQUE: Multiplanar, multiecho pulse sequences of the brain and surrounding structures, and cervical and thoracic and lumbar spine were obtained without and with intravenous contrast. COMPARISON:  None Available. FINDINGS: MRI HEAD FINDINGS Brain: Negative for an acute infarct. There are multiple periventricular and subcortical T2/FLAIR hyperintense lesions,including at the callososeptal interface. These are worrisome for demyelinating lesions. There is no evidence of true diffusion restriction or contrast enhancement to definitively suggest active demyelination. T2 hyperintense lesions are also present in the pons. No hemorrhage. No extra-axial fluid collection. No hydrocephalus. Vascular: Normal flow voids. Skull and upper cervical spine: Normal marrow signal. Sinuses/Orbits: No middle ear or mastoid effusion. Paranasal sinuses are clear. Orbits are unremarkable. Note that assessment of the optic nerves is limited due to technique. Other: None. MRI CERVICAL SPINE FINDINGS Alignment: Physiologic. Vertebrae: No fracture, evidence of discitis, or bone lesion. Cord: The cord is normal in caliber. There is no convincing lesion in the cervical spine to definitively suggest demyelinating disease. No evidence of contrast enhancement to suggest active demyelination. Posterior Fossa, vertebral arteries, paraspinal tissues: Negative. Disc levels: No evidence of high-grade spinal canal stenosis. MRI THORACIC SPINE FINDINGS Alignment:  Physiologic. Vertebrae: No fracture, evidence of discitis, or bone lesion. Cord: There is a T2 hyperintense lesion at the level of the T7-T8 disc space (series 19, image 10). This lesion likely  demonstrates contrast enhancement (series 23, image 11). Paraspinal and other soft tissues: Negative. Disc levels: No evidence of high-grade spinal canal or neural foraminal stenosis. MRI LUMBAR SPINE FINDINGS Segmentation:  Standard. Alignment:  Physiologic. Vertebrae:  No fracture, evidence of discitis, or bone lesion. Conus medullaris and cauda equina: Conus extends to the L1-L2 disc space level. Conus and cauda equina appear normal. Paraspinal and other soft tissues: Negative. Disc levels: No evidence of high-grade spinal canal or neural foraminal stenosis. IMPRESSION: 1. Multiple periventricular and subcortical T2/FLAIR hyperintense lesions, including at the callososeptal interface. Findings are worrisome for demyelinating disease (Multiple sclerosis). No evidence of active demyelination in the brain. 2. T2 hyperintense lesion at the level of the T7-T8 disc space, which likely demonstrates contrast enhancement. This is worrisome for an active demyelinating lesion. 3. No convincing lesion in the spine to definitively suggest demyelinating disease. 4. Normal appearance of the lumbar spine. Electronically Signed   By: Lorenza Cambridge M.D.   On: 01/11/2023 13:18   MR Cervical Spine W and Wo Contrast  Result Date: 01/11/2023 CLINICAL DATA:  Neuro deficit, acute, stroke suspected; Myelopathy, chronic, thoracic spine; Myelopathy, acute, lumbar spine; Myelopathy, acute, cervical spine EXAM: MRI HEAD WITHOUT AND WITH CONTRAST MRI CERVICAL SPINE WITHOUT AND WITH CONTRAST MRI THORACIC SPINE WITHOUT AND CONTRAST MRI LUMBAR SPINE WITHOUT AND CONTRAST CONTRAST:  10mL GADAVIST GADOBUTROL 1 MMOL/ML IV SOLN TECHNIQUE: Multiplanar, multiecho pulse sequences of the brain and surrounding structures, and cervical and thoracic and lumbar spine were obtained without and with intravenous contrast. COMPARISON:  None Available. FINDINGS: MRI HEAD FINDINGS Brain: Negative for an acute infarct. There are multiple periventricular and  subcortical T2/FLAIR hyperintense lesions,including at the callososeptal interface. These are worrisome for demyelinating lesions. There is no evidence of true diffusion restriction or contrast enhancement to definitively suggest active demyelination. T2 hyperintense lesions are also present in the pons. No hemorrhage. No extra-axial fluid collection. No hydrocephalus. Vascular: Normal flow voids. Skull and upper cervical spine: Normal marrow signal. Sinuses/Orbits: No middle ear or mastoid effusion. Paranasal sinuses are  clear. Orbits are unremarkable. Note that assessment of the optic nerves is limited due to technique. Other: None. MRI CERVICAL SPINE FINDINGS Alignment: Physiologic. Vertebrae: No fracture, evidence of discitis, or bone lesion. Cord: The cord is normal in caliber. There is no convincing lesion in the cervical spine to definitively suggest demyelinating disease. No evidence of contrast enhancement to suggest active demyelination. Posterior Fossa, vertebral arteries, paraspinal tissues: Negative. Disc levels: No evidence of high-grade spinal canal stenosis. MRI THORACIC SPINE FINDINGS Alignment:  Physiologic. Vertebrae: No fracture, evidence of discitis, or bone lesion. Cord: There is a T2 hyperintense lesion at the level of the T7-T8 disc space (series 19, image 10). This lesion likely demonstrates contrast enhancement (series 23, image 11). Paraspinal and other soft tissues: Negative. Disc levels: No evidence of high-grade spinal canal or neural foraminal stenosis. MRI LUMBAR SPINE FINDINGS Segmentation:  Standard. Alignment:  Physiologic. Vertebrae:  No fracture, evidence of discitis, or bone lesion. Conus medullaris and cauda equina: Conus extends to the L1-L2 disc space level. Conus and cauda equina appear normal. Paraspinal and other soft tissues: Negative. Disc levels: No evidence of high-grade spinal canal or neural foraminal stenosis. IMPRESSION: 1. Multiple periventricular and subcortical  T2/FLAIR hyperintense lesions, including at the callososeptal interface. Findings are worrisome for demyelinating disease (Multiple sclerosis). No evidence of active demyelination in the brain. 2. T2 hyperintense lesion at the level of the T7-T8 disc space, which likely demonstrates contrast enhancement. This is worrisome for an active demyelinating lesion. 3. No convincing lesion in the spine to definitively suggest demyelinating disease. 4. Normal appearance of the lumbar spine. Electronically Signed   By: Lorenza Cambridge M.D.   On: 01/11/2023 13:18   MR THORACIC SPINE W WO CONTRAST  Result Date: 01/11/2023 CLINICAL DATA:  Neuro deficit, acute, stroke suspected; Myelopathy, chronic, thoracic spine; Myelopathy, acute, lumbar spine; Myelopathy, acute, cervical spine EXAM: MRI HEAD WITHOUT AND WITH CONTRAST MRI CERVICAL SPINE WITHOUT AND WITH CONTRAST MRI THORACIC SPINE WITHOUT AND CONTRAST MRI LUMBAR SPINE WITHOUT AND CONTRAST CONTRAST:  10mL GADAVIST GADOBUTROL 1 MMOL/ML IV SOLN TECHNIQUE: Multiplanar, multiecho pulse sequences of the brain and surrounding structures, and cervical and thoracic and lumbar spine were obtained without and with intravenous contrast. COMPARISON:  None Available. FINDINGS: MRI HEAD FINDINGS Brain: Negative for an acute infarct. There are multiple periventricular and subcortical T2/FLAIR hyperintense lesions,including at the callososeptal interface. These are worrisome for demyelinating lesions. There is no evidence of true diffusion restriction or contrast enhancement to definitively suggest active demyelination. T2 hyperintense lesions are also present in the pons. No hemorrhage. No extra-axial fluid collection. No hydrocephalus. Vascular: Normal flow voids. Skull and upper cervical spine: Normal marrow signal. Sinuses/Orbits: No middle ear or mastoid effusion. Paranasal sinuses are clear. Orbits are unremarkable. Note that assessment of the optic nerves is limited due to technique.  Other: None. MRI CERVICAL SPINE FINDINGS Alignment: Physiologic. Vertebrae: No fracture, evidence of discitis, or bone lesion. Cord: The cord is normal in caliber. There is no convincing lesion in the cervical spine to definitively suggest demyelinating disease. No evidence of contrast enhancement to suggest active demyelination. Posterior Fossa, vertebral arteries, paraspinal tissues: Negative. Disc levels: No evidence of high-grade spinal canal stenosis. MRI THORACIC SPINE FINDINGS Alignment:  Physiologic. Vertebrae: No fracture, evidence of discitis, or bone lesion. Cord: There is a T2 hyperintense lesion at the level of the T7-T8 disc space (series 19, image 10). This lesion likely demonstrates contrast enhancement (series 23, image 11). Paraspinal and other soft tissues: Negative. Disc  levels: No evidence of high-grade spinal canal or neural foraminal stenosis. MRI LUMBAR SPINE FINDINGS Segmentation:  Standard. Alignment:  Physiologic. Vertebrae:  No fracture, evidence of discitis, or bone lesion. Conus medullaris and cauda equina: Conus extends to the L1-L2 disc space level. Conus and cauda equina appear normal. Paraspinal and other soft tissues: Negative. Disc levels: No evidence of high-grade spinal canal or neural foraminal stenosis. IMPRESSION: 1. Multiple periventricular and subcortical T2/FLAIR hyperintense lesions, including at the callososeptal interface. Findings are worrisome for demyelinating disease (Multiple sclerosis). No evidence of active demyelination in the brain. 2. T2 hyperintense lesion at the level of the T7-T8 disc space, which likely demonstrates contrast enhancement. This is worrisome for an active demyelinating lesion. 3. No convincing lesion in the spine to definitively suggest demyelinating disease. 4. Normal appearance of the lumbar spine. Electronically Signed   By: Lorenza Cambridge M.D.   On: 01/11/2023 13:18   MR Lumbar Spine W Wo Contrast  Result Date: 01/11/2023 CLINICAL  DATA:  Neuro deficit, acute, stroke suspected; Myelopathy, chronic, thoracic spine; Myelopathy, acute, lumbar spine; Myelopathy, acute, cervical spine EXAM: MRI HEAD WITHOUT AND WITH CONTRAST MRI CERVICAL SPINE WITHOUT AND WITH CONTRAST MRI THORACIC SPINE WITHOUT AND CONTRAST MRI LUMBAR SPINE WITHOUT AND CONTRAST CONTRAST:  10mL GADAVIST GADOBUTROL 1 MMOL/ML IV SOLN TECHNIQUE: Multiplanar, multiecho pulse sequences of the brain and surrounding structures, and cervical and thoracic and lumbar spine were obtained without and with intravenous contrast. COMPARISON:  None Available. FINDINGS: MRI HEAD FINDINGS Brain: Negative for an acute infarct. There are multiple periventricular and subcortical T2/FLAIR hyperintense lesions,including at the callososeptal interface. These are worrisome for demyelinating lesions. There is no evidence of true diffusion restriction or contrast enhancement to definitively suggest active demyelination. T2 hyperintense lesions are also present in the pons. No hemorrhage. No extra-axial fluid collection. No hydrocephalus. Vascular: Normal flow voids. Skull and upper cervical spine: Normal marrow signal. Sinuses/Orbits: No middle ear or mastoid effusion. Paranasal sinuses are clear. Orbits are unremarkable. Note that assessment of the optic nerves is limited due to technique. Other: None. MRI CERVICAL SPINE FINDINGS Alignment: Physiologic. Vertebrae: No fracture, evidence of discitis, or bone lesion. Cord: The cord is normal in caliber. There is no convincing lesion in the cervical spine to definitively suggest demyelinating disease. No evidence of contrast enhancement to suggest active demyelination. Posterior Fossa, vertebral arteries, paraspinal tissues: Negative. Disc levels: No evidence of high-grade spinal canal stenosis. MRI THORACIC SPINE FINDINGS Alignment:  Physiologic. Vertebrae: No fracture, evidence of discitis, or bone lesion. Cord: There is a T2 hyperintense lesion at the level  of the T7-T8 disc space (series 19, image 10). This lesion likely demonstrates contrast enhancement (series 23, image 11). Paraspinal and other soft tissues: Negative. Disc levels: No evidence of high-grade spinal canal or neural foraminal stenosis. MRI LUMBAR SPINE FINDINGS Segmentation:  Standard. Alignment:  Physiologic. Vertebrae:  No fracture, evidence of discitis, or bone lesion. Conus medullaris and cauda equina: Conus extends to the L1-L2 disc space level. Conus and cauda equina appear normal. Paraspinal and other soft tissues: Negative. Disc levels: No evidence of high-grade spinal canal or neural foraminal stenosis. IMPRESSION: 1. Multiple periventricular and subcortical T2/FLAIR hyperintense lesions, including at the callososeptal interface. Findings are worrisome for demyelinating disease (Multiple sclerosis). No evidence of active demyelination in the brain. 2. T2 hyperintense lesion at the level of the T7-T8 disc space, which likely demonstrates contrast enhancement. This is worrisome for an active demyelinating lesion. 3. No convincing lesion in the spine to  definitively suggest demyelinating disease. 4. Normal appearance of the lumbar spine. Electronically Signed   By: Lorenza Cambridge M.D.   On: 01/11/2023 13:18    Pending Labs Unresulted Labs (From admission, onward)     Start     Ordered   01/18/23 0500  Creatinine, serum  (enoxaparin (LOVENOX)    CrCl >/= 30 ml/min)  Weekly,   R     Comments: while on enoxaparin therapy    01/11/23 1420   01/12/23 0500  Comprehensive metabolic panel  Tomorrow morning,   R        01/11/23 1420   01/12/23 0500  CBC  Tomorrow morning,   R        01/11/23 1420   01/12/23 0500  Magnesium  Tomorrow morning,   R        01/11/23 1420   01/12/23 0500  Phosphorus  Tomorrow morning,   R        01/11/23 1420   01/11/23 1417  HIV Antibody (routine testing w rflx)  (HIV Antibody (Routine testing w reflex) panel)  Once,   R        01/11/23 1420   01/11/23 1417   CBC  (enoxaparin (LOVENOX)    CrCl >/= 30 ml/min)  Once,   R       Comments: Baseline for enoxaparin therapy IF NOT ALREADY DRAWN.  Notify MD if PLT < 100 K.    01/11/23 1420   01/11/23 1417  Creatinine, serum  (enoxaparin (LOVENOX)    CrCl >/= 30 ml/min)  Once,   R       Comments: Baseline for enoxaparin therapy IF NOT ALREADY DRAWN.    01/11/23 1420   01/11/23 1405  Urine Culture  Once,   AD       Question:  Indication  Answer:  Urgency/frequency   01/11/23 1404            Vitals/Pain Today's Vitals   01/11/23 0930 01/11/23 1030 01/11/23 1301 01/11/23 1330  BP: (!) 141/89 (!) 152/87 127/74 (!) 140/120  Pulse: 66 62 65 (!) 52  Resp: 18 18 18 18   Temp:   98.4 F (36.9 C)   TempSrc:   Oral   SpO2: 100% 98% 100% 100%  Weight:      PainSc:        Isolation Precautions No active isolations  Medications Medications  methylPREDNISolone sodium succinate (SOLU-MEDROL) 1,000 mg in sodium chloride 0.9 % 50 mL IVPB (has no administration in time range)    And  pantoprazole (PROTONIX) injection 40 mg (40 mg Intravenous Given 01/11/23 1431)  cefTRIAXone (ROCEPHIN) 2 g in sodium chloride 0.9 % 100 mL IVPB (2 g Intravenous New Bag/Given 01/11/23 1431)  enoxaparin (LOVENOX) injection 40 mg (has no administration in time range)  0.9 %  sodium chloride infusion (has no administration in time range)  acetaminophen (TYLENOL) tablet 650 mg (has no administration in time range)    Or  acetaminophen (TYLENOL) suppository 650 mg (has no administration in time range)  docusate sodium (COLACE) capsule 100 mg (100 mg Oral Given 01/11/23 1431)  ondansetron (ZOFRAN) tablet 4 mg (has no administration in time range)    Or  ondansetron (ZOFRAN) injection 4 mg (has no administration in time range)  cefTRIAXone (ROCEPHIN) 1 g in sodium chloride 0.9 % 100 mL IVPB (has no administration in time range)  gadobutrol (GADAVIST) 1 MMOL/ML injection 10 mL (10 mLs Intravenous Contrast Given 01/11/23 1210)  Mobility walks     Focused Assessments Peripheral numbness   R Recommendations: See Admitting Provider Note  Report given to:   Additional Notes: .

## 2023-01-12 DIAGNOSIS — E7800 Pure hypercholesterolemia, unspecified: Secondary | ICD-10-CM | POA: Diagnosis not present

## 2023-01-12 DIAGNOSIS — F322 Major depressive disorder, single episode, severe without psychotic features: Secondary | ICD-10-CM | POA: Diagnosis not present

## 2023-01-12 DIAGNOSIS — R200 Anesthesia of skin: Secondary | ICD-10-CM | POA: Diagnosis not present

## 2023-01-12 DIAGNOSIS — E559 Vitamin D deficiency, unspecified: Secondary | ICD-10-CM | POA: Insufficient documentation

## 2023-01-12 DIAGNOSIS — G35 Multiple sclerosis: Secondary | ICD-10-CM | POA: Diagnosis not present

## 2023-01-12 DIAGNOSIS — R531 Weakness: Secondary | ICD-10-CM | POA: Diagnosis not present

## 2023-01-12 LAB — COMPREHENSIVE METABOLIC PANEL
ALT: 16 U/L (ref 0–44)
AST: 18 U/L (ref 15–41)
Albumin: 3.3 g/dL — ABNORMAL LOW (ref 3.5–5.0)
Alkaline Phosphatase: 83 U/L (ref 38–126)
Anion gap: 8 (ref 5–15)
BUN: 15 mg/dL (ref 6–20)
CO2: 21 mmol/L — ABNORMAL LOW (ref 22–32)
Calcium: 9.4 mg/dL (ref 8.9–10.3)
Chloride: 109 mmol/L (ref 98–111)
Creatinine, Ser: 0.84 mg/dL (ref 0.44–1.00)
GFR, Estimated: >60 mL/min (ref >60)
Glucose, Bld: 135 mg/dL — ABNORMAL HIGH (ref 70–99)
Potassium: 4.2 mmol/L (ref 3.5–5.1)
Sodium: 138 mmol/L (ref 135–145)
Total Bilirubin: 0.4 mg/dL (ref 0.3–1.2)
Total Protein: 6.9 g/dL (ref 6.5–8.1)

## 2023-01-12 LAB — GLUCOSE, CAPILLARY
Glucose-Capillary: 150 mg/dL — ABNORMAL HIGH (ref 70–99)
Glucose-Capillary: 175 mg/dL — ABNORMAL HIGH (ref 70–99)
Glucose-Capillary: 129 mg/dL — ABNORMAL HIGH (ref 70–99)
Glucose-Capillary: 149 mg/dL — ABNORMAL HIGH (ref 70–99)

## 2023-01-12 LAB — CBC
HCT: 37.6 % (ref 36.0–46.0)
Hemoglobin: 12.3 g/dL (ref 12.0–15.0)
MCH: 28.2 pg (ref 26.0–34.0)
MCHC: 32.7 g/dL (ref 30.0–36.0)
MCV: 86.2 fL (ref 80.0–100.0)
Platelets: 262 10*3/uL (ref 150–400)
RBC: 4.36 MIL/uL (ref 3.87–5.11)
RDW: 13.8 % (ref 11.5–15.5)
WBC: 9.3 10*3/uL (ref 4.0–10.5)
nRBC: 0 % (ref 0.0–0.2)

## 2023-01-12 LAB — VITAMIN D 25 HYDROXY (VIT D DEFICIENCY, FRACTURES): Vit D, 25-Hydroxy: 6.03 ng/mL — ABNORMAL LOW (ref 30–100)

## 2023-01-12 LAB — LIPID PANEL
Cholesterol: 176 mg/dL (ref 0–200)
HDL: 51 mg/dL (ref >40)
LDL Cholesterol: 119 mg/dL — ABNORMAL HIGH (ref 0–99)
Total CHOL/HDL Ratio: 3.5 RATIO
Triglycerides: 31 mg/dL (ref <150)
VLDL: 6 mg/dL (ref 0–40)

## 2023-01-12 LAB — PHOSPHORUS: Phosphorus: 2.5 mg/dL (ref 2.5–4.6)

## 2023-01-12 LAB — MAGNESIUM: Magnesium: 2.1 mg/dL (ref 1.7–2.4)

## 2023-01-12 MED ORDER — VITAMIN D (ERGOCALCIFEROL) 1.25 MG (50000 UNIT) PO CAPS
50000.0000 [IU] | ORAL_CAPSULE | ORAL | Status: DC
  Administered 2023-01-12: 50000 [IU] via ORAL
  Filled 2023-01-12: qty 1

## 2023-01-12 NOTE — Progress Notes (Signed)
Neurology progress note  S: Patient reports mild subjective improvement in her LLE numbness and weakness. Today is day 2 of steroids  O:  Vitals:   01/12/23 1633 01/12/23 2011  BP: 120/84 108/69  Pulse: 60 72  Resp: 18 18  Temp: 98.3 F (36.8 C) 98 F (36.7 C)  SpO2: 99% 99%    Exam  General: Laying comfortably in bed; in no acute distress.  HENT: Normal oropharynx and mucosa. Normal external appearance of ears and nose.  Neck: Supple, no pain or tenderness  CV: No JVD. No peripheral edema.  Pulmonary: Symmetric Chest rise. Normal respiratory effort.  Abdomen: Soft to touch, non-tender.  Ext: No cyanosis, edema, or deformity  Skin: No rash. Normal palpation of skin.   Musculoskeletal: Normal digits and nails by inspection. No clubbing.    Neurologic Examination  Mental status/Cognition: Alert, oriented to self, place, month and year, good attention.  Speech/language: Fluent, comprehension intact, object naming intact, repetition intact.  Cranial nerves:   CN II Pupils equal and reactive to light, no VF deficits    CN III,IV,VI EOM intact, no gaze preference or deviation, no nystagmus    CN V normal sensation in V1, V2, and V3 segments bilaterally    CN VII no asymmetry, no nasolabial fold flattening    CN VIII normal hearing to speech    CN IX & X normal palatal elevation, no uvular deviation    CN XI 5/5 head turn and 5/5 shoulder shrug bilaterally    CN XII midline tongue protrusion     Motor:  Muscle bulk: normal, tone normal, pronator drift none tremor none Mvmt Root Nerve  Muscle Right Left Comments  SA C5/6 Ax Deltoid 5 5    EF C5/6 Mc Biceps 5 5    EE C6/7/8 Rad Triceps 5 5    WF C6/7 Med FCR        WE C7/8 PIN ECU        F Ab C8/T1 U ADM/FDI 5 5    HF L1/2/3 Fem Illopsoas 5 4    KE L2/3/4 Fem Quad 5 5    DF L4/5 D Peron Tib Ant 5 5    PF S1/2 Tibial Grc/Sol 5 5      Reflexes:   Right Left Comments  Pectoralis         Biceps (C5/6) 2 2     Brachioradialis (C5/6) 2 2     Triceps (C6/7) 2 2     Patellar (L3/4) 2 2     Achilles (S1) 1 1     Hoffman         Plantar mute mute    Jaw jerk       Sensation:  Light touch Paresthesias in LLE to light touch   Pin prick     Temperature     Vibration    Proprioception      Coordination/Complex Motor:  - Finger to Nose intact BL - Heel to shin difficult to do with weakness and body habitus - Rapid alternating movement are normal - Gait: deferred for patient safety.  Data  MR Brain, C, T, L spine w + w/o C(personally reviewed): Several T2/FLAIR lesions, patterns more consistent with demyelinating disease rather than small vessel. Has T2/FLAIR lesion with enhancement at T7-8 level consistent with active demyelination.  A/P: Dana Woods is a 22 y.o. female with PMH significant for HLD, obesity, who presents with 4 day hx of LLE weakness and numbness.  Found to have Several T2/FLAIR lesions, patterns more consistent with demyelinating disease rather than small vessel. Has T2/FLAIR lesion with enhancement at T7-8 level consistent with active demyelination. She now fits the revised Mcdonald critera for MS. Her neurologic examination is notable for mild LLE weakness and paresthesias. This is starting to improve on solumedrol.  # New diagnosis of Multiple Sclerosis with current flare - IV solumedrol 1000mg  daily x 5 doses.  - PPI while on steroids - NMO panel, MOG Ab and Vit D levels. - PT and OT. - follow up with Dr. Despina Arias with [facility] - Will continue to follow  Bing Neighbors, MD [facility] [phone]  If 7pm- 7am, please page neurology on call as listed in AMION.

## 2023-01-12 NOTE — Progress Notes (Signed)
PROGRESS NOTE  Dana Woods:[id number] DOB: 2001-01-05   PCP: Pcp, No  Patient is from: Home  DOA: 01/11/2023 LOS: 1  Chief complaints Chief Complaint  Patient presents with   Numbness     Brief Narrative / Interim history: 22 year old F with PMH of asthma, depression, HLD and morbid obesity presenting with right lower extremity weakness and numbness after her waist, and found to have new diagnosis of multiple sclerosis with flareup. MRI Brain, C, T and L-spine showed, C spine, T spine and L spine multiple periventricular and subcortical T2/FLAIR hyperintense lesions, including at the callososeptal interface without evidence of active demyelination in the brain. T2 hyperintense lesion at the level of the T7-T8 disc space, which likely demonstrates contrast enhancement worrisome for an active demyelinating lesion.  Neurology consulted and started high-dose Solu-Medrol.  NMO or panel, MOG antibody and vitamin D levels pending.  Also started on ceftriaxone for possible UTI.    Subjective: Seen and examined earlier this morning.  No major events overnight of this morning.  Still with numbness in RLE and waist bilaterally.  She denies UTI symptoms.  Objective: Vitals:   01/11/23 2300 01/12/23 0300 01/12/23 0801 01/12/23 1116  BP: 110/72 116/66 131/77 121/83  Pulse:  65 70 81  Resp: 18 17 18 18   Temp: 97.8 F (36.6 C) 98.2 F (36.8 C) 98.2 F (36.8 C) 98.4 F (36.9 C)  TempSrc: Oral Oral Oral Oral  SpO2: 99% 98% 95% 97%  Weight:        Examination:  GENERAL: No apparent distress.  Nontoxic. HEENT: MMM.  Vision and hearing grossly intact.  NECK: Supple.  No apparent JVD.  RESP:  No IWOB.  Fair aeration bilaterally. CVS:  RRR. Heart sounds normal.  ABD/GI/GU: BS+. Abd soft, NTND.  MSK/EXT:  Moves extremities. No apparent deformity. No edema.  SKIN: no apparent skin lesion or wound NEURO: Awake, alert and oriented appropriately.  No apparent focal neuro deficit. PSYCH:  Calm. Normal affect.   Procedures:  None  Microbiology summarized: Urine culture pending  Assessment and plan: Principal Problem:   Multiple sclerosis with acute neurologic event (HCC) Active Problems:   Obesity peds (BMI >=95 percentile)   Hypercholesteremia   Current severe episode of major depressive disorder without psychotic features without prior episode (HCC)  Right lower extremity numbness and weakness: New diagnosis of multiple sclerosis with flare -MRI finding as above -Neurology recs:  - IV solumedrol 1g daily x 5 doses. Can be switched to PO after 2-3 days if she shows consistent improvement - PPI while on steroids - NMO panel, MOG Ab and Vit D levels. - PT and OT. - follow up with Dr. Despina Arias with [facility].     Bacteriuria with pyuria: UA with pyuria and bacteria but patient denies UTI symptoms.  Started on ceftriaxone. -Complete short course of ceftriaxone -Follow urine culture  Vitamin D deficiency: Vitamin D level 6.0. -Vitamin D 50,000 units weekly starting on 4/23   Hyperlipidemia: LDL 119.  Prior history of CAD, stroke or diabetes. -Encourage lifestyle change to lose weight     Major depression: Stable.  Seems to be off Prozac.   Mild intermittent asthma: Stable.  Does not seem to be on medication.   Morbid obesity Body mass index is 45.19 kg/m. -Encourage lifestyle change to lose weight.          DVT prophylaxis:  enoxaparin (LOVENOX) injection 40 mg Start: 01/11/23 1430  Code Status: Full code Family Communication: None  at bedside Level of care: Telemetry Medical Status is: Inpatient Remains inpatient appropriate because: MS flare   Final disposition: Home Consultants:  Neurology  35 minutes with more than 50% spent in reviewing records, counseling patient/family and coordinating care.   Sch Meds:  Scheduled Meds:  docusate sodium  100 mg Oral BID   enoxaparin (LOVENOX) injection  40 mg Subcutaneous Q24H    pantoprazole (PROTONIX) IV  40 mg Intravenous Q24H   Vitamin D (Ergocalciferol)  50,000 Units Oral Q7 days   Continuous Infusions:  cefTRIAXone (ROCEPHIN)  IV     methylPREDNISolone (SOLU-MEDROL) injection     PRN Meds:.acetaminophen **OR** acetaminophen, hydrALAZINE, ondansetron **OR** ondansetron (ZOFRAN) IV  Antimicrobials: Anti-infectives (From admission, onward)    Start     Dose/Rate Route Frequency Ordered Stop   01/12/23 1400  cefTRIAXone (ROCEPHIN) 1 g in sodium chloride 0.9 % 100 mL IVPB        1 g 200 mL/hr over 30 Minutes Intravenous Every 24 hours 01/11/23 1423 01/17/23 1359   01/11/23 1415  cefTRIAXone (ROCEPHIN) 2 g in sodium chloride 0.9 % 100 mL IVPB        2 g 200 mL/hr over 30 Minutes Intravenous  Once 01/11/23 1404 01/11/23 1500        I have personally reviewed the following labs and images: CBC: Recent Labs  Lab 01/11/23 0826 01/11/23 1700 01/12/23 0505  WBC 7.5 8.2 9.3  NEUTROABS 4.6  --   --   HGB 11.8* 11.4* 12.3  HCT 37.9 35.4* 37.6  MCV 89.4 88.1 86.2  PLT 290 307 262   BMP &GFR Recent Labs  Lab 01/11/23 0826 01/11/23 1700 01/12/23 0505  NA 136  --  138  K 3.8  --  4.2  CL 110  --  109  CO2 25  --  21*  GLUCOSE 81  --  135*  BUN 15  --  15  CREATININE 0.86 0.82 0.84  CALCIUM 8.8*  --  9.4  MG 2.2  --  2.1  PHOS  --   --  2.5   Estimated Creatinine Clearance: 140.9 mL/min (by C-G formula based on SCr of 0.84 mg/dL). Liver & Pancreas: Recent Labs  Lab 01/11/23 0826 01/12/23 0505  AST 15 18  ALT 14 16  ALKPHOS 79 83  BILITOT 0.6 0.4  PROT 7.7 6.9  ALBUMIN 3.9 3.3*   No results for input(s): "LIPASE", "AMYLASE" in the last 168 hours. No results for input(s): "AMMONIA" in the last 168 hours. Diabetic: No results for input(s): "HGBA1C" in the last 72 hours. Recent Labs  Lab 01/11/23 2142 01/12/23 0624 01/12/23 1118  GLUCAP 108* 150* 175*   Cardiac Enzymes: No results for input(s): "CKTOTAL", "CKMB", "CKMBINDEX",  "TROPONINI" in the last 168 hours. No results for input(s): "PROBNP" in the last 8760 hours. Coagulation Profile: No results for input(s): "INR", "PROTIME" in the last 168 hours. Thyroid Function Tests: No results for input(s): "TSH", "T4TOTAL", "FREET4", "T3FREE", "THYROIDAB" in the last 72 hours. Lipid Profile: Recent Labs    01/12/23 0505  CHOL 176  HDL 51  LDLCALC 119*  TRIG 31  CHOLHDL 3.5   Anemia Panel: No results for input(s): "VITAMINB12", "FOLATE", "FERRITIN", "TIBC", "IRON", "RETICCTPCT" in the last 72 hours. Urine analysis:    Component Value Date/Time   COLORURINE YELLOW 01/11/2023 0847   APPEARANCEUR CLOUDY (A) 01/11/2023 0847   LABSPEC 1.028 01/11/2023 0847   PHURINE 6.0 01/11/2023 0847   GLUCOSEU NEGATIVE 01/11/2023 0847  HGBUR MODERATE (A) 01/11/2023 0847   BILIRUBINUR NEGATIVE 01/11/2023 0847   BILIRUBINUR neg 03/20/2013 1041   KETONESUR 5 (A) 01/11/2023 0847   PROTEINUR 30 (A) 01/11/2023 0847   UROBILINOGEN 0.2 07/13/2019 1200   NITRITE NEGATIVE 01/11/2023 0847   LEUKOCYTESUR MODERATE (A) 01/11/2023 0847   Sepsis Labs: Invalid input(s): "PROCALCITONIN", "LACTICIDVEN"  Microbiology: No results found for this or any previous visit (from the past 240 hour(s)).  Radiology Studies: MR Brain W and Wo Contrast  Result Date: 01/11/2023 CLINICAL DATA:  Neuro deficit, acute, stroke suspected; Myelopathy, chronic, thoracic spine; Myelopathy, acute, lumbar spine; Myelopathy, acute, cervical spine EXAM: MRI HEAD WITHOUT AND WITH CONTRAST MRI CERVICAL SPINE WITHOUT AND WITH CONTRAST MRI THORACIC SPINE WITHOUT AND CONTRAST MRI LUMBAR SPINE WITHOUT AND CONTRAST CONTRAST:  10mL GADAVIST GADOBUTROL 1 MMOL/ML IV SOLN TECHNIQUE: Multiplanar, multiecho pulse sequences of the brain and surrounding structures, and cervical and thoracic and lumbar spine were obtained without and with intravenous contrast. COMPARISON:  None Available. FINDINGS: MRI HEAD FINDINGS Brain: Negative  for an acute infarct. There are multiple periventricular and subcortical T2/FLAIR hyperintense lesions,including at the callososeptal interface. These are worrisome for demyelinating lesions. There is no evidence of true diffusion restriction or contrast enhancement to definitively suggest active demyelination. T2 hyperintense lesions are also present in the pons. No hemorrhage. No extra-axial fluid collection. No hydrocephalus. Vascular: Normal flow voids. Skull and upper cervical spine: Normal marrow signal. Sinuses/Orbits: No middle ear or mastoid effusion. Paranasal sinuses are clear. Orbits are unremarkable. Note that assessment of the optic nerves is limited due to technique. Other: None. MRI CERVICAL SPINE FINDINGS Alignment: Physiologic. Vertebrae: No fracture, evidence of discitis, or bone lesion. Cord: The cord is normal in caliber. There is no convincing lesion in the cervical spine to definitively suggest demyelinating disease. No evidence of contrast enhancement to suggest active demyelination. Posterior Fossa, vertebral arteries, paraspinal tissues: Negative. Disc levels: No evidence of high-grade spinal canal stenosis. MRI THORACIC SPINE FINDINGS Alignment:  Physiologic. Vertebrae: No fracture, evidence of discitis, or bone lesion. Cord: There is a T2 hyperintense lesion at the level of the T7-T8 disc space (series 19, image 10). This lesion likely demonstrates contrast enhancement (series 23, image 11). Paraspinal and other soft tissues: Negative. Disc levels: No evidence of high-grade spinal canal or neural foraminal stenosis. MRI LUMBAR SPINE FINDINGS Segmentation:  Standard. Alignment:  Physiologic. Vertebrae:  No fracture, evidence of discitis, or bone lesion. Conus medullaris and cauda equina: Conus extends to the L1-L2 disc space level. Conus and cauda equina appear normal. Paraspinal and other soft tissues: Negative. Disc levels: No evidence of high-grade spinal canal or neural foraminal  stenosis. IMPRESSION: 1. Multiple periventricular and subcortical T2/FLAIR hyperintense lesions, including at the callososeptal interface. Findings are worrisome for demyelinating disease (Multiple sclerosis). No evidence of active demyelination in the brain. 2. T2 hyperintense lesion at the level of the T7-T8 disc space, which likely demonstrates contrast enhancement. This is worrisome for an active demyelinating lesion. 3. No convincing lesion in the spine to definitively suggest demyelinating disease. 4. Normal appearance of the lumbar spine. Electronically Signed   By: Lorenza Cambridge M.D.   On: 01/11/2023 13:18   MR Cervical Spine W and Wo Contrast  Result Date: 01/11/2023 CLINICAL DATA:  Neuro deficit, acute, stroke suspected; Myelopathy, chronic, thoracic spine; Myelopathy, acute, lumbar spine; Myelopathy, acute, cervical spine EXAM: MRI HEAD WITHOUT AND WITH CONTRAST MRI CERVICAL SPINE WITHOUT AND WITH CONTRAST MRI THORACIC SPINE WITHOUT AND CONTRAST MRI LUMBAR SPINE  WITHOUT AND CONTRAST CONTRAST:  10mL GADAVIST GADOBUTROL 1 MMOL/ML IV SOLN TECHNIQUE: Multiplanar, multiecho pulse sequences of the brain and surrounding structures, and cervical and thoracic and lumbar spine were obtained without and with intravenous contrast. COMPARISON:  None Available. FINDINGS: MRI HEAD FINDINGS Brain: Negative for an acute infarct. There are multiple periventricular and subcortical T2/FLAIR hyperintense lesions,including at the callososeptal interface. These are worrisome for demyelinating lesions. There is no evidence of true diffusion restriction or contrast enhancement to definitively suggest active demyelination. T2 hyperintense lesions are also present in the pons. No hemorrhage. No extra-axial fluid collection. No hydrocephalus. Vascular: Normal flow voids. Skull and upper cervical spine: Normal marrow signal. Sinuses/Orbits: No middle ear or mastoid effusion. Paranasal sinuses are clear. Orbits are unremarkable.  Note that assessment of the optic nerves is limited due to technique. Other: None. MRI CERVICAL SPINE FINDINGS Alignment: Physiologic. Vertebrae: No fracture, evidence of discitis, or bone lesion. Cord: The cord is normal in caliber. There is no convincing lesion in the cervical spine to definitively suggest demyelinating disease. No evidence of contrast enhancement to suggest active demyelination. Posterior Fossa, vertebral arteries, paraspinal tissues: Negative. Disc levels: No evidence of high-grade spinal canal stenosis. MRI THORACIC SPINE FINDINGS Alignment:  Physiologic. Vertebrae: No fracture, evidence of discitis, or bone lesion. Cord: There is a T2 hyperintense lesion at the level of the T7-T8 disc space (series 19, image 10). This lesion likely demonstrates contrast enhancement (series 23, image 11). Paraspinal and other soft tissues: Negative. Disc levels: No evidence of high-grade spinal canal or neural foraminal stenosis. MRI LUMBAR SPINE FINDINGS Segmentation:  Standard. Alignment:  Physiologic. Vertebrae:  No fracture, evidence of discitis, or bone lesion. Conus medullaris and cauda equina: Conus extends to the L1-L2 disc space level. Conus and cauda equina appear normal. Paraspinal and other soft tissues: Negative. Disc levels: No evidence of high-grade spinal canal or neural foraminal stenosis. IMPRESSION: 1. Multiple periventricular and subcortical T2/FLAIR hyperintense lesions, including at the callososeptal interface. Findings are worrisome for demyelinating disease (Multiple sclerosis). No evidence of active demyelination in the brain. 2. T2 hyperintense lesion at the level of the T7-T8 disc space, which likely demonstrates contrast enhancement. This is worrisome for an active demyelinating lesion. 3. No convincing lesion in the spine to definitively suggest demyelinating disease. 4. Normal appearance of the lumbar spine. Electronically Signed   By: Lorenza Cambridge M.D.   On: 01/11/2023 13:18    MR THORACIC SPINE W WO CONTRAST  Result Date: 01/11/2023 CLINICAL DATA:  Neuro deficit, acute, stroke suspected; Myelopathy, chronic, thoracic spine; Myelopathy, acute, lumbar spine; Myelopathy, acute, cervical spine EXAM: MRI HEAD WITHOUT AND WITH CONTRAST MRI CERVICAL SPINE WITHOUT AND WITH CONTRAST MRI THORACIC SPINE WITHOUT AND CONTRAST MRI LUMBAR SPINE WITHOUT AND CONTRAST CONTRAST:  10mL GADAVIST GADOBUTROL 1 MMOL/ML IV SOLN TECHNIQUE: Multiplanar, multiecho pulse sequences of the brain and surrounding structures, and cervical and thoracic and lumbar spine were obtained without and with intravenous contrast. COMPARISON:  None Available. FINDINGS: MRI HEAD FINDINGS Brain: Negative for an acute infarct. There are multiple periventricular and subcortical T2/FLAIR hyperintense lesions,including at the callososeptal interface. These are worrisome for demyelinating lesions. There is no evidence of true diffusion restriction or contrast enhancement to definitively suggest active demyelination. T2 hyperintense lesions are also present in the pons. No hemorrhage. No extra-axial fluid collection. No hydrocephalus. Vascular: Normal flow voids. Skull and upper cervical spine: Normal marrow signal. Sinuses/Orbits: No middle ear or mastoid effusion. Paranasal sinuses are clear. Orbits are unremarkable. Note that  assessment of the optic nerves is limited due to technique. Other: None. MRI CERVICAL SPINE FINDINGS Alignment: Physiologic. Vertebrae: No fracture, evidence of discitis, or bone lesion. Cord: The cord is normal in caliber. There is no convincing lesion in the cervical spine to definitively suggest demyelinating disease. No evidence of contrast enhancement to suggest active demyelination. Posterior Fossa, vertebral arteries, paraspinal tissues: Negative. Disc levels: No evidence of high-grade spinal canal stenosis. MRI THORACIC SPINE FINDINGS Alignment:  Physiologic. Vertebrae: No fracture, evidence of  discitis, or bone lesion. Cord: There is a T2 hyperintense lesion at the level of the T7-T8 disc space (series 19, image 10). This lesion likely demonstrates contrast enhancement (series 23, image 11). Paraspinal and other soft tissues: Negative. Disc levels: No evidence of high-grade spinal canal or neural foraminal stenosis. MRI LUMBAR SPINE FINDINGS Segmentation:  Standard. Alignment:  Physiologic. Vertebrae:  No fracture, evidence of discitis, or bone lesion. Conus medullaris and cauda equina: Conus extends to the L1-L2 disc space level. Conus and cauda equina appear normal. Paraspinal and other soft tissues: Negative. Disc levels: No evidence of high-grade spinal canal or neural foraminal stenosis. IMPRESSION: 1. Multiple periventricular and subcortical T2/FLAIR hyperintense lesions, including at the callososeptal interface. Findings are worrisome for demyelinating disease (Multiple sclerosis). No evidence of active demyelination in the brain. 2. T2 hyperintense lesion at the level of the T7-T8 disc space, which likely demonstrates contrast enhancement. This is worrisome for an active demyelinating lesion. 3. No convincing lesion in the spine to definitively suggest demyelinating disease. 4. Normal appearance of the lumbar spine. Electronically Signed   By: Lorenza Cambridge M.D.   On: 01/11/2023 13:18   MR Lumbar Spine W Wo Contrast  Result Date: 01/11/2023 CLINICAL DATA:  Neuro deficit, acute, stroke suspected; Myelopathy, chronic, thoracic spine; Myelopathy, acute, lumbar spine; Myelopathy, acute, cervical spine EXAM: MRI HEAD WITHOUT AND WITH CONTRAST MRI CERVICAL SPINE WITHOUT AND WITH CONTRAST MRI THORACIC SPINE WITHOUT AND CONTRAST MRI LUMBAR SPINE WITHOUT AND CONTRAST CONTRAST:  10mL GADAVIST GADOBUTROL 1 MMOL/ML IV SOLN TECHNIQUE: Multiplanar, multiecho pulse sequences of the brain and surrounding structures, and cervical and thoracic and lumbar spine were obtained without and with intravenous  contrast. COMPARISON:  None Available. FINDINGS: MRI HEAD FINDINGS Brain: Negative for an acute infarct. There are multiple periventricular and subcortical T2/FLAIR hyperintense lesions,including at the callososeptal interface. These are worrisome for demyelinating lesions. There is no evidence of true diffusion restriction or contrast enhancement to definitively suggest active demyelination. T2 hyperintense lesions are also present in the pons. No hemorrhage. No extra-axial fluid collection. No hydrocephalus. Vascular: Normal flow voids. Skull and upper cervical spine: Normal marrow signal. Sinuses/Orbits: No middle ear or mastoid effusion. Paranasal sinuses are clear. Orbits are unremarkable. Note that assessment of the optic nerves is limited due to technique. Other: None. MRI CERVICAL SPINE FINDINGS Alignment: Physiologic. Vertebrae: No fracture, evidence of discitis, or bone lesion. Cord: The cord is normal in caliber. There is no convincing lesion in the cervical spine to definitively suggest demyelinating disease. No evidence of contrast enhancement to suggest active demyelination. Posterior Fossa, vertebral arteries, paraspinal tissues: Negative. Disc levels: No evidence of high-grade spinal canal stenosis. MRI THORACIC SPINE FINDINGS Alignment:  Physiologic. Vertebrae: No fracture, evidence of discitis, or bone lesion. Cord: There is a T2 hyperintense lesion at the level of the T7-T8 disc space (series 19, image 10). This lesion likely demonstrates contrast enhancement (series 23, image 11). Paraspinal and other soft tissues: Negative. Disc levels: No evidence of high-grade spinal  canal or neural foraminal stenosis. MRI LUMBAR SPINE FINDINGS Segmentation:  Standard. Alignment:  Physiologic. Vertebrae:  No fracture, evidence of discitis, or bone lesion. Conus medullaris and cauda equina: Conus extends to the L1-L2 disc space level. Conus and cauda equina appear normal. Paraspinal and other soft tissues:  Negative. Disc levels: No evidence of high-grade spinal canal or neural foraminal stenosis. IMPRESSION: 1. Multiple periventricular and subcortical T2/FLAIR hyperintense lesions, including at the callososeptal interface. Findings are worrisome for demyelinating disease (Multiple sclerosis). No evidence of active demyelination in the brain. 2. T2 hyperintense lesion at the level of the T7-T8 disc space, which likely demonstrates contrast enhancement. This is worrisome for an active demyelinating lesion. 3. No convincing lesion in the spine to definitively suggest demyelinating disease. 4. Normal appearance of the lumbar spine. Electronically Signed   By: Lorenza Cambridge M.D.   On: 01/11/2023 13:18      Dana Woods T. Qunicy Higinbotham [facility]  If 7PM-7AM, please contact night-coverage [email or web address] 01/12/2023, 11:46 AM

## 2023-01-13 DIAGNOSIS — F322 Major depressive disorder, single episode, severe without psychotic features: Secondary | ICD-10-CM | POA: Diagnosis not present

## 2023-01-13 DIAGNOSIS — G35 Multiple sclerosis: Secondary | ICD-10-CM | POA: Diagnosis not present

## 2023-01-13 DIAGNOSIS — E559 Vitamin D deficiency, unspecified: Secondary | ICD-10-CM | POA: Diagnosis not present

## 2023-01-13 DIAGNOSIS — E7800 Pure hypercholesterolemia, unspecified: Secondary | ICD-10-CM | POA: Diagnosis not present

## 2023-01-13 LAB — GLUCOSE, CAPILLARY
Glucose-Capillary: 114 mg/dL — ABNORMAL HIGH (ref 70–99)
Glucose-Capillary: 135 mg/dL — ABNORMAL HIGH (ref 70–99)

## 2023-01-13 LAB — URINE CULTURE: Culture: >=100000 — AB

## 2023-01-13 LAB — HEMOGLOBIN A1C
Hgb A1c MFr Bld: 5 % (ref 4.8–5.6)
Mean Plasma Glucose: 97 mg/dL

## 2023-01-13 LAB — NEUROMYELITIS OPTICA AUTOAB, IGG: NMO-IgG: <1.5 U/mL (ref 0.0–3.0)

## 2023-01-13 LAB — MISC LABCORP TEST (SEND OUT): Labcorp test code: 505310

## 2023-01-13 MED ORDER — AMOXICILLIN 500 MG PO CAPS
500.0000 mg | ORAL_CAPSULE | Freq: Two times a day (BID) | ORAL | Status: DC
  Administered 2023-01-13 – 2023-01-14 (×4): 500 mg via ORAL
  Filled 2023-01-13 (×4): qty 1

## 2023-01-13 NOTE — Progress Notes (Signed)
PROGRESS NOTE  Dana Woods Dana Woods DOB: 16-Oct-2000   PCP: Pcp, No  Patient is from: Home  DOA: 01/11/2023 LOS: 2  Chief complaints Chief Complaint  Patient presents with   Numbness     Brief Narrative / Interim history: 22 year old F with PMH of asthma, depression, HLD and morbid obesity presenting with right lower extremity weakness and numbness after her waist, and found to have new diagnosis of multiple sclerosis with flareup. MRI Brain, C, T and L-spine showed, C spine, T spine and L spine multiple periventricular and subcortical T2/FLAIR hyperintense lesions, including at the callososeptal interface without evidence of active demyelination in the brain. T2 hyperintense lesion at the level of the T7-T8 disc space, which likely demonstrates contrast enhancement worrisome for an active demyelinating lesion.  Neurology consulted and started high-dose Solu-Medrol.  NMO or panel, MOG antibody and vitamin D levels pending.  Also started on ceftriaxone for possible UTI.    Neuro symptoms improved with high-dose Solu-Medrol.  Neurology following.  Urine culture with pansensitive Proteus mirabilis except resistance to Macrobid.  Antibiotic de-escalated to p.o. amoxicillin    Subjective: Seen and examined earlier this morning.  Orts improvement in numbness and weakness.  She says she was able to walk.  Objective: Vitals:   01/13/23 0017 01/13/23 0323 01/13/23 0719 01/13/23 1120  BP: 123/62 116/70 125/84 (!) 112/90  Pulse: (!) 57 (!) 59 (!) 51 80  Resp: 18 18 18 16   Temp: 98.1 F (36.7 C) 97.8 F (36.6 C) 98.5 F (36.9 C) 97.8 F (36.6 C)  TempSrc: Oral Oral Oral Oral  SpO2: 95% 98% 100% 97%  Weight:      Height:        Examination: GENERAL: No apparent distress.  Nontoxic. HEENT: MMM.  Vision and hearing grossly intact.  NECK: Supple.  No apparent JVD.  RESP:  No IWOB.  Fair aeration bilaterally. CVS:  RRR. Heart sounds normal.  ABD/GI/GU: BS+. Abd soft, NTND.   MSK/EXT:   No apparent deformity. Moves extremities. No edema.  SKIN: no apparent skin lesion or wound NEURO: Awake and alert. Oriented appropriately.  No apparent focal neuro deficit. PSYCH: Calm. Normal affect.   Procedures:  None  Microbiology summarized: Urine culture with Proteus mirabilis  Assessment and plan: Principal Problem:   Multiple sclerosis with acute neurologic event (HCC) Active Problems:   Morbid obesity (HCC)   Hypercholesteremia   Current severe episode of major depressive disorder without psychotic features without prior episode (HCC)   Vitamin D deficiency  Right lower extremity numbness and weakness: New diagnosis of multiple sclerosis with flare -MRI finding as above -Neurology recs:  - IV solumedrol 1g daily x 5 doses - PPI while on steroids - NMO panel, MOG Ab and Vit D levels. - PT and OT. - Follow up with Dr. Despina Arias with [facility].     Bacteriuria with pyuria: UA with pyuria and bacteria but patient denies UTI symptoms.  Started on ceftriaxone.  Urine culture with pansensitive Proteus mirabilis except resistant to Macrobid. -Received ceftriaxone x 2.  Complete antibiotic course with amoxicillin  Vitamin D deficiency: Vitamin D level 6.0. -Vitamin D 50,000 units weekly starting on 4/23   Hyperlipidemia: LDL 119.  Prior history of CAD, stroke or diabetes. -Encourage lifestyle change to lose weight   Major depression: Stable.  Seems to be off Prozac.   Mild intermittent asthma: Stable.  Does not seem to be on medication.   Morbid obesity Body mass index is 45.19  kg/m. -Encourage lifestyle change to lose weight.          DVT prophylaxis:  enoxaparin (LOVENOX) injection 40 mg Start: 01/11/23 1430  Code Status: Full code Family Communication: None at bedside Level of care: Med-Surg Status is: Inpatient Remains inpatient appropriate because: MS flare   Final disposition: Home Consultants:  Neurology  35 minutes  with more than 50% spent in reviewing records, counseling patient/family and coordinating care.   Sch Meds:  Scheduled Meds:  amoxicillin  500 mg Oral Q12H   docusate sodium  100 mg Oral BID   enoxaparin (LOVENOX) injection  40 mg Subcutaneous Q24H   pantoprazole (PROTONIX) IV  40 mg Intravenous Q24H   Vitamin D (Ergocalciferol)  50,000 Units Oral Q7 days   Continuous Infusions:  methylPREDNISolone (SOLU-MEDROL) injection 1,000 mg (01/13/23 1152)   PRN Meds:.acetaminophen **OR** acetaminophen, hydrALAZINE, ondansetron **OR** ondansetron (ZOFRAN) IV  Antimicrobials: Anti-infectives (From admission, onward)    Start     Dose/Rate Route Frequency Ordered Stop   01/13/23 1330  amoxicillin (AMOXIL) capsule 500 mg        500 mg Oral Every 12 hours 01/13/23 1242 01/16/23 0959   01/12/23 1400  cefTRIAXone (ROCEPHIN) 1 g in sodium chloride 0.9 % 100 mL IVPB  Status:  Discontinued        1 g 200 mL/hr over 30 Minutes Intravenous Every 24 hours 01/11/23 1423 01/13/23 1242   01/11/23 1415  cefTRIAXone (ROCEPHIN) 2 g in sodium chloride 0.9 % 100 mL IVPB        2 g 200 mL/hr over 30 Minutes Intravenous  Once 01/11/23 1404 01/11/23 1500        I have personally reviewed the following labs and images: CBC: Recent Labs  Lab 01/11/23 0826 01/11/23 1700 01/12/23 0505  WBC 7.5 8.2 9.3  NEUTROABS 4.6  --   --   HGB 11.8* 11.4* 12.3  HCT 37.9 35.4* 37.6  MCV 89.4 88.1 86.2  PLT 290 307 262   BMP &GFR Recent Labs  Lab 01/11/23 0826 01/11/23 1700 01/12/23 0505  NA 136  --  138  K 3.8  --  4.2  CL 110  --  109  CO2 25  --  21*  GLUCOSE 81  --  135*  BUN 15  --  15  CREATININE 0.86 0.82 0.84  CALCIUM 8.8*  --  9.4  MG 2.2  --  2.1  PHOS  --   --  2.5   Estimated Creatinine Clearance: 140.9 mL/min (by C-G formula based on SCr of 0.84 mg/dL). Liver & Pancreas: Recent Labs  Lab 01/11/23 0826 01/12/23 0505  AST 15 18  ALT 14 16  ALKPHOS 79 83  BILITOT 0.6 0.4  PROT 7.7 6.9   ALBUMIN 3.9 3.3*   No results for input(s): "LIPASE", "AMYLASE" in the last 168 hours. No results for input(s): "AMMONIA" in the last 168 hours. Diabetic: Recent Labs    01/12/23 0505  HGBA1C 5.0   Recent Labs  Lab 01/12/23 1118 01/12/23 1633 01/12/23 2125 01/13/23 0610 01/13/23 1120  GLUCAP 175* 129* 149* 114* 135*   Cardiac Enzymes: No results for input(s): "CKTOTAL", "CKMB", "CKMBINDEX", "TROPONINI" in the last 168 hours. No results for input(s): "PROBNP" in the last 8760 hours. Coagulation Profile: No results for input(s): "INR", "PROTIME" in the last 168 hours. Thyroid Function Tests: No results for input(s): "TSH", "T4TOTAL", "FREET4", "T3FREE", "THYROIDAB" in the last 72 hours. Lipid Profile: Recent Labs    01/12/23 0505  CHOL 176  HDL 51  LDLCALC 119*  TRIG 31  CHOLHDL 3.5   Anemia Panel: No results for input(s): "VITAMINB12", "FOLATE", "FERRITIN", "TIBC", "IRON", "RETICCTPCT" in the last 72 hours. Urine analysis:    Component Value Date/Time   COLORURINE YELLOW 01/11/2023 0847   APPEARANCEUR CLOUDY (A) 01/11/2023 0847   LABSPEC 1.028 01/11/2023 0847   PHURINE 6.0 01/11/2023 0847   GLUCOSEU NEGATIVE 01/11/2023 0847   HGBUR MODERATE (A) 01/11/2023 0847   BILIRUBINUR NEGATIVE 01/11/2023 0847   BILIRUBINUR neg 03/20/2013 1041   KETONESUR 5 (A) 01/11/2023 0847   PROTEINUR 30 (A) 01/11/2023 0847   UROBILINOGEN 0.2 07/13/2019 1200   NITRITE NEGATIVE 01/11/2023 0847   LEUKOCYTESUR MODERATE (A) 01/11/2023 0847   Sepsis Labs: Invalid input(s): "PROCALCITONIN", "LACTICIDVEN"  Microbiology: Recent Results (from the past 240 hour(s))  Urine Culture     Status: Abnormal   Collection Time: 01/11/23  8:26 AM   Specimen: Urine, Clean Catch  Result Value Ref Range Status   Specimen Description   Final    URINE, CLEAN CATCH Performed at [facility], [address], [place], [place] [address]    Special Requests   Final     NONE Performed at [facility], [address], [place], [place] [address]    Culture >=100,000 COLONIES/mL PROTEUS MIRABILIS (A)  Final   Report Status 01/13/2023 FINAL  Final   Organism ID, Bacteria PROTEUS MIRABILIS (A)  Final      Susceptibility   Proteus mirabilis - MIC*    AMPICILLIN <=2 SENSITIVE Sensitive     CEFAZOLIN <=4 SENSITIVE Sensitive     CEFEPIME <=0.12 SENSITIVE Sensitive     CEFTRIAXONE <=0.25 SENSITIVE Sensitive     CIPROFLOXACIN <=0.25 SENSITIVE Sensitive     GENTAMICIN <=1 SENSITIVE Sensitive     IMIPENEM 4 SENSITIVE Sensitive     NITROFURANTOIN 128 RESISTANT Resistant     TRIMETH/SULFA <=20 SENSITIVE Sensitive     AMPICILLIN/SULBACTAM <=2 SENSITIVE Sensitive     PIP/TAZO <=4 SENSITIVE Sensitive     * >=100,000 COLONIES/mL PROTEUS MIRABILIS    Radiology Studies: No results found.    Oluwatomisin Deman T. Laura-Lee Villegas [facility]  If 7PM-7AM, please contact night-coverage [email or web address] 01/13/2023, 12:54 PM

## 2023-01-13 NOTE — TOC Initial Note (Signed)
Transition of Care [facility]) - Initial/Assessment Note    Patient Details  Name: Dana Woods MRN: [id number] Date of Birth: 25-Oct-2000  Transition of Care [facility] CM/SW Contact:    Lockie Pares, RN Phone Number: 01/13/2023, 1:19 PM  Clinical Narrative:                  22 year old presented with new onset of MS.  Has had weakness off and on. Patient lives with her mother in a apartment on the second floor. She is currently employed. Spoke to patient on phone, introduced self and explained role.  PT assessment noted. Patient agrees she does not need therapies at this time. Discussed DME, and she would like a walker for longer walks and appointments. Conferred with PT and 4 wheel walker with seat ordered via [employer].    Patient gave permission to make follow up appointments.  She has a f/u with establishing PCP and will make a post hospital neurology appointment to establish there.   TOC will follow for needs, recommendations, and transitions of care. Expected Discharge Plan: Home/Self Care Barriers to Discharge: Continued Medical Work up   Patient Goals and CMS Choice            Expected Discharge Plan and Services   Discharge Planning Services: CM Consult   Living arrangements for the past 2 months: Apartment                 DME Arranged: Walker rolling with seat DME Agency: [employer] Date DME Agency Contacted: 01/13/23 Time DME Agency Contacted: 1252 Representative spoke with at DME Agency: Vaughan Basta            Prior Living Arrangements/Services Living arrangements for the past 2 months: Apartment Lives with:: Parents Patient language and need for interpreter reviewed:: Yes Do you feel safe going back to the place where you live?: Yes      Need for Family Participation in Patient Care: Yes (Comment) Care giver support system in place?: Yes (comment)   Criminal Activity/Legal Involvement Pertinent to Current Situation/Hospitalization: No - Comment as  needed  Activities of Daily Living Home Assistive Devices/Equipment: None ADL Screening (condition at time of admission) Patient's cognitive ability adequate to safely complete daily activities?: Yes Is the patient deaf or have difficulty hearing?: No Does the patient have difficulty seeing, even when wearing glasses/contacts?: Yes Does the patient have difficulty concentrating, remembering, or making decisions?: No Patient able to express need for assistance with ADLs?: No Does the patient have difficulty dressing or bathing?: No Independently performs ADLs?: No Communication: Independent Dressing (OT): Independent Grooming: Independent Feeding: Independent Bathing: Independent Toileting: Independent In/Out Bed: Independent Walks in Home: Independent Does the patient have difficulty walking or climbing stairs?: No Weakness of Legs: Both Weakness of Arms/Hands: None  Permission Sought/Granted                  Emotional Assessment   Attitude/Demeanor/Rapport: Gracious Affect (typically observed): Accepting Orientation: : Oriented to Self, Oriented to Place, Oriented to  Time, Oriented to Situation Alcohol / Substance Use: Not Applicable Psych Involvement: No (comment)  Admission diagnosis:  Numbness [R20.0] Weakness [R53.1] Cystitis [N30.90] Multiple sclerosis with acute neurologic event [facility]) [G35] Patient Active Problem List   Diagnosis Date Noted   Vitamin D deficiency 01/12/2023   Multiple sclerosis with acute neurologic event (HCC) 01/11/2023   Current severe episode of major depressive disorder without psychotic features without prior episode (HCC) 02/08/2019   Acanthosis  nigricans 11/08/2016   Ganglion cyst 09/20/2013   Hypercholesteremia 04/12/2013   Morbid obesity (HCC) 03/20/2013   PCP:  Pcp, No Pharmacy:   [id number] - [place], [place] - [address] AT [facility] [address] [place]  [address] Phone: [phone] Fax: [phone]  [id number] [place], [place] - [address] AT [facility] [address] [place] [address] Phone: [phone] Fax: [phone]  [facility] [address], [place] - [address] [place] [address] Phone: [phone] Fax: [phone]     Social Determinants of Health (SDOH) Social History: SDOH Screenings   Food Insecurity: No Food Insecurity (01/11/2023)  Housing: Low Risk  (01/11/2023)  Transportation Needs: No Transportation Needs (01/11/2023)  Utilities: Not At Risk (01/11/2023)  Tobacco Use: High Risk (01/11/2023)   SDOH Interventions:     Readmission Risk Interventions     No data to display

## 2023-01-13 NOTE — Evaluation (Signed)
Physical Therapy Evaluation Patient Details Name: Dana Woods MRN: [id number] DOB: 2001/01/13 Today's Date: 01/13/2023  History of Present Illness  Pt is a 22 y.o. female who presented 01/11/23 with LLE weakness and numbness. Imaging revealed multiple periventricular and subcortical T2/FLAIR hyperintense lesions, including at the callososeptal interface, and T2 hyperintense lesion at the level of the T7-T8 disc space. Pt now fits the revised Mcdonald criteria for MS. PMH: HLD, obesity, asthma   Clinical Impression  Pt presents with condition above and deficits mentioned below, see PT Problem List. PTA, she was working, driving, independent, and living with her mother in a 2nd level apartment. Currently, pt displays intact and symmetrical bil lower extremity strength, sensation, dynamic proprioception, and coordination with formal testing in sitting. However, functionally she does display deficits in L lower extremity coordination and strength, evident by her occasional deficits in L foot clearance and poor L foot placement when stepping. She was able to perform all functional mobility without LOB, UE support, or physical assistance though, including stairs. Pt will likely progress well with continued treatment, thus she will likely not need any PT follow-up at d/c. Will continue to follow acutely.     Recommendations for follow up therapy are one component of a multi-disciplinary discharge planning process, led by the attending physician.  Recommendations may be updated based on patient status, additional functional criteria and insurance authorization.  Follow Up Recommendations       Assistance Recommended at Discharge PRN  Patient can return home with the following  Assistance with cooking/housework    Equipment Recommendations None recommended by PT  Recommendations for Other Services       Functional Status Assessment Patient has had a recent decline in their functional status and  demonstrates the ability to make significant improvements in function in a reasonable and predictable amount of time.     Precautions / Restrictions Precautions Precautions: Fall (low risk) Restrictions Weight Bearing Restrictions: No      Mobility  Bed Mobility Overal bed mobility: Modified Independent             General bed mobility comments: HOB elevated, no assistance needed    Transfers Overall transfer level: Independent Equipment used: None               General transfer comment: No assistance needed, no LOB    Ambulation/Gait Ambulation/Gait assistance: Supervision Gait Distance (Feet): 700 Feet Assistive device: None Gait Pattern/deviations: Step-through pattern, Decreased dorsiflexion - left, Narrow base of support, Wide base of support, Drifts right/left Gait velocity: WFL Gait velocity interpretation: >4.37 ft/sec, indicative of normal walking speed   General Gait Details: Pt with no overt LOB but displays intermittent circuduction of L leg then narrow placement of L leg and other times when stepping directly forward with L her foot does not fully clear. Cues provided to maintain shoulder width stance and dorsiflex L ankle for improved foot clearance and placement, mod success noted. Supervision for safety  Stairs Stairs: Yes Stairs assistance: Supervision Stair Management: No rails, Two rails, Alternating pattern, Forwards, Backwards Number of Stairs: 10 General stair comments: Ascends forward and descends backwards without rails the first x2 stairs then ascended and descended forwards with bil rails the final x8 stairs. No LOB, supervision for safety  Wheelchair Mobility    Modified Rankin (Stroke Patients Only) Modified Rankin (Stroke Patients Only) Pre-Morbid Rankin Score: No symptoms Modified Rankin: No significant disability     Balance Overall balance assessment: Mild deficits observed, not formally  tested                                            Pertinent Vitals/Pain Pain Assessment Pain Assessment: Faces Faces Pain Scale: No hurt Pain Intervention(s): Monitored during session    Home Living Family/patient expects to be discharged to:: Private residence Living Arrangements: Parent (mother) Available Help at Discharge: Family;Available 24 hours/day (mother works 3rd shift, sister can stay if needed) Type of Home: Apartment Home Access: Stairs to enter Entrance Stairs-Rails: Can reach both Entrance Stairs-Number of Steps: flight   Home Layout: One level Home Equipment: None      Prior Function Prior Level of Function : Independent/Modified Independent;Driving;Working/employed             Mobility Comments: No AD ADLs Comments: Works standing on her feet long periods of time at [employer]        Extremity/Trunk Assessment   Upper Extremity Assessment Upper Extremity Assessment: Defer to OT evaluation    Lower Extremity Assessment Lower Extremity Assessment: LLE deficits/detail LLE Deficits / Details: noted weakness and incoordination grossly with functional mobility, when performing formal tests it was not evident; MMT scores of 5 grossly bil; coordination intact with rapid-alternating-movements and heel-to-shin bil; sensation and dynamic proprioception intact bil LLE Sensation: WNL    Cervical / Trunk Assessment Cervical / Trunk Assessment: Normal  Communication   Communication: No difficulties  Cognition Arousal/Alertness: Awake/alert Behavior During Therapy: WFL for tasks assessed/performed Overall Cognitive Status: Within Functional Limits for tasks assessed                                          General Comments General comments (skin integrity, edema, etc.): educated pt on resources for people with MS and to do moderate intensity (not vigorous) exercise and avoid very hot conditions to ensure she does not exacerbate or over  fatigue herself    Exercises     Assessment/Plan    PT Assessment Patient needs continued PT services  PT Problem List Decreased strength;Decreased balance;Decreased mobility       PT Treatment Interventions DME instruction;Gait training;Stair training;Functional mobility training;Therapeutic activities;Therapeutic exercise;Balance training;Neuromuscular re-education;Patient/family education    PT Goals (Current goals can be found in the Care Plan section)  Acute Rehab PT Goals Patient Stated Goal: to improve and return to work PT Goal Formulation: With patient Time For Goal Achievement: 01/27/23 Potential to Achieve Goals: Good    Frequency Min 3X/week     Co-evaluation               AM-PAC PT "6 Clicks" Mobility  Outcome Measure Help needed turning from your back to your side while in a flat bed without using bedrails?: None Help needed moving from lying on your back to sitting on the side of a flat bed without using bedrails?: None Help needed moving to and from a bed to a chair (including a wheelchair)?: None Help needed standing up from a chair using your arms (e.g., wheelchair or bedside chair)?: None Help needed to walk in hospital room?: A Little Help needed climbing 3-5 steps with a railing? : A Little 6 Click Score: 22    End of Session   Activity Tolerance: Patient tolerated treatment well Patient left:  in bed;with call bell/phone within reach (no alarm on upon arrival, pt reporting RN took alarm off of her)   PT Visit Diagnosis: Unsteadiness on feet (R26.81);Other abnormalities of gait and mobility (R26.89);Muscle weakness (generalized) (M62.81);Other symptoms and signs involving the nervous system (R29.898)    Time: 4098-1191 PT Time Calculation (min) (ACUTE ONLY): 19 min   Charges:   PT Evaluation $PT Eval Low Complexity: 1 Low          Raymond Gurney, PT, DPT Acute Rehabilitation Services  Office: [phone]   Jewel Baize 01/13/2023, 12:34 PM

## 2023-01-14 DIAGNOSIS — F322 Major depressive disorder, single episode, severe without psychotic features: Secondary | ICD-10-CM | POA: Diagnosis not present

## 2023-01-14 DIAGNOSIS — E7800 Pure hypercholesterolemia, unspecified: Secondary | ICD-10-CM | POA: Diagnosis not present

## 2023-01-14 DIAGNOSIS — G35 Multiple sclerosis: Secondary | ICD-10-CM | POA: Diagnosis not present

## 2023-01-14 NOTE — Progress Notes (Signed)
PROGRESS NOTE  Dana Woods:[id number] DOB: June 03, 2001   PCP: Pcp, No  Patient is from: Home  DOA: 01/11/2023 LOS: 3  Chief complaints Chief Complaint  Patient presents with   Numbness     Brief Narrative / Interim history: 22 year old F with PMH of asthma, depression, HLD and morbid obesity presenting with right lower extremity weakness and numbness after her waist, and found to have new diagnosis of multiple sclerosis with flareup. MRI Brain, C, T and L-spine showed, C spine, T spine and L spine multiple periventricular and subcortical T2/FLAIR hyperintense lesions, including at the callososeptal interface without evidence of active demyelination in the brain. T2 hyperintense lesion at the level of the T7-T8 disc space, which likely demonstrates contrast enhancement worrisome for an active demyelinating lesion.  Neurology consulted and started high-dose Solu-Medrol.  NMO or panel, MOG antibody and vitamin D levels pending.  Also started on ceftriaxone for possible UTI.    Neuro symptoms improved with high-dose Solu-Medrol.  Neurology following.  Urine culture with pansensitive Proteus mirabilis except resistance to Macrobid.  Antibiotic de-escalated to p.o. amoxicillin    Subjective: Seen and examined earlier this morning.  No major events overnight of this morning.  No complaints.  Reports improvement in leg numbness.  She says she walked in the hallway with therapy yesterday.  Objective: Vitals:   01/13/23 2338 01/14/23 0318 01/14/23 0730 01/14/23 1128  BP: 131/74 (!) 145/81 110/75 105/61  Pulse: (!) 45 (!) 48 (!) 51 (!) 50  Resp: 16 16 14 14   Temp: 98.1 F (36.7 C) 97.9 F (36.6 C) 98 F (36.7 C) 97.8 F (36.6 C)  TempSrc: Oral Oral Oral Oral  SpO2: 96% 99% 100% 100%  Weight:      Height:        Examination: GENERAL: No apparent distress.  Nontoxic. HEENT: MMM.  Vision and hearing grossly intact.  NECK: Supple.  No apparent JVD.  RESP:  No IWOB.  Fair aeration  bilaterally. CVS:  RRR. Heart sounds normal.  ABD/GI/GU: BS+. Abd soft, NTND.  MSK/EXT:   No apparent deformity. Moves extremities. No edema.  SKIN: no apparent skin lesion or wound NEURO: Awake and alert. Oriented appropriately.  No apparent focal neuro deficit. PSYCH: Calm. Normal affect.   Procedures:  None  Microbiology summarized: Urine culture with Proteus mirabilis  Assessment and plan: Principal Problem:   Multiple sclerosis with acute neurologic event (HCC) Active Problems:   Morbid obesity (HCC)   Hypercholesteremia   Current severe episode of major depressive disorder without psychotic features without prior episode (HCC)   Vitamin D deficiency  Right lower extremity numbness and weakness: New diagnosis of multiple sclerosis with flare: NMO and MGO ab negative. -MRI finding as above -Neurology recs:  - IV solumedrol 1g daily x 5 doses.  Last dose on 5/26 - PPI while on steroids - PT and OT. - Follow up with [facility] Despina [facility] with [facility].     Bacteriuria with pyuria: UA with pyuria and bacteria but patient denies UTI symptoms.  Started on ceftriaxone.  Urine culture with pansensitive Proteus mirabilis except resistant to Macrobid. -Received ceftriaxone x 2.  Complete antibiotic course with amoxicillin  Vitamin D deficiency: Vitamin D level 6.0. -Vitamin D 50,000 units weekly starting on 4/23   Hyperlipidemia: LDL 119.  Prior history of CAD, stroke or diabetes. -Encourage lifestyle change to lose weight   Major depression: Stable.  Seems to be off Prozac.   Mild intermittent asthma: Stable.  Does  not seem to be on medication.   Morbid obesity Body mass index is 45.19 kg/m. -Encourage lifestyle change to lose weight.          DVT prophylaxis:  enoxaparin (LOVENOX) injection 40 mg Start: 01/11/23 1430  Code Status: Full code Family Communication: None at bedside Level of care: Med-Surg Status is: Inpatient Remains inpatient appropriate  because: MS flare   Final disposition: Home Consultants:  Neurology  35 minutes with more than 50% spent in reviewing records, counseling patient/family and coordinating care.   Sch Meds:  Scheduled Meds:  amoxicillin  500 mg Oral Q12H   docusate sodium  100 mg Oral BID   enoxaparin (LOVENOX) injection  40 mg Subcutaneous Q24H   pantoprazole (PROTONIX) IV  40 mg Intravenous Q24H   Vitamin D (Ergocalciferol)  50,000 Units Oral Q7 days   Continuous Infusions:  methylPREDNISolone (SOLU-MEDROL) injection Stopped (01/13/23 1253)   PRN Meds:.acetaminophen **OR** acetaminophen, hydrALAZINE, ondansetron **OR** ondansetron (ZOFRAN) IV  Antimicrobials: Anti-infectives (From admission, onward)    Start     Dose/Rate Route Frequency Ordered Stop   01/13/23 1330  amoxicillin (AMOXIL) capsule 500 mg        500 mg Oral Every 12 hours 01/13/23 1242 01/16/23 0959   01/12/23 1400  cefTRIAXone (ROCEPHIN) 1 g in sodium chloride 0.9 % 100 mL IVPB  Status:  Discontinued        1 g 200 mL/hr over 30 Minutes Intravenous Every 24 hours 01/11/23 1423 01/13/23 1242   01/11/23 1415  cefTRIAXone (ROCEPHIN) 2 g in sodium chloride 0.9 % 100 mL IVPB        2 g 200 mL/hr over 30 Minutes Intravenous  Once 01/11/23 1404 01/11/23 1500        I have personally reviewed the following labs and images: CBC: Recent Labs  Lab 01/11/23 0826 01/11/23 1700 01/12/23 0505  WBC 7.5 8.2 9.3  NEUTROABS 4.6  --   --   HGB 11.8* 11.4* 12.3  HCT 37.9 35.4* 37.6  MCV 89.4 88.1 86.2  PLT 290 307 262   BMP &GFR Recent Labs  Lab 01/11/23 0826 01/11/23 1700 01/12/23 0505  NA 136  --  138  K 3.8  --  4.2  CL 110  --  109  CO2 25  --  21*  GLUCOSE 81  --  135*  BUN 15  --  15  CREATININE 0.86 0.82 0.84  CALCIUM 8.8*  --  9.4  MG 2.2  --  2.1  PHOS  --   --  2.5   Estimated Creatinine Clearance: 140.9 mL/min (by C-G formula based on SCr of 0.84 mg/dL). Liver & Pancreas: Recent Labs  Lab 01/11/23 0826  01/12/23 0505  AST 15 18  ALT 14 16  ALKPHOS 79 83  BILITOT 0.6 0.4  PROT 7.7 6.9  ALBUMIN 3.9 3.3*   No results for input(s): "LIPASE", "AMYLASE" in the last 168 hours. No results for input(s): "AMMONIA" in the last 168 hours. Diabetic: Recent Labs    01/12/23 0505  HGBA1C 5.0   Recent Labs  Lab 01/12/23 1118 01/12/23 1633 01/12/23 2125 01/13/23 0610 01/13/23 1120  GLUCAP 175* 129* 149* 114* 135*   Cardiac Enzymes: No results for input(s): "CKTOTAL", "CKMB", "CKMBINDEX", "TROPONINI" in the last 168 hours. No results for input(s): "PROBNP" in the last 8760 hours. Coagulation Profile: No results for input(s): "INR", "PROTIME" in the last 168 hours. Thyroid Function Tests: No results for input(s): "TSH", "T4TOTAL", "FREET4", "T3FREE", "THYROIDAB"  in the last 72 hours. Lipid Profile: Recent Labs    01/12/23 0505  CHOL 176  HDL 51  LDLCALC 119*  TRIG 31  CHOLHDL 3.5   Anemia Panel: No results for input(s): "VITAMINB12", "FOLATE", "FERRITIN", "TIBC", "IRON", "RETICCTPCT" in the last 72 hours. Urine analysis:    Component Value Date/Time   COLORURINE YELLOW 01/11/2023 0847   APPEARANCEUR CLOUDY (A) 01/11/2023 0847   LABSPEC 1.028 01/11/2023 0847   PHURINE 6.0 01/11/2023 0847   GLUCOSEU NEGATIVE 01/11/2023 0847   HGBUR MODERATE (A) 01/11/2023 0847   BILIRUBINUR NEGATIVE 01/11/2023 0847   BILIRUBINUR neg 03/20/2013 1041   KETONESUR 5 (A) 01/11/2023 0847   PROTEINUR 30 (A) 01/11/2023 0847   UROBILINOGEN 0.2 07/13/2019 1200   NITRITE NEGATIVE 01/11/2023 0847   LEUKOCYTESUR MODERATE (A) 01/11/2023 0847   Sepsis Labs: Invalid input(s): "PROCALCITONIN", "LACTICIDVEN"  Microbiology: Recent Results (from the past 240 hour(s))  Urine Culture     Status: Abnormal   Collection Time: 01/11/23  8:26 AM   Specimen: Urine, Clean Catch  Result Value Ref Range Status   Specimen Description   Final    URINE, CLEAN CATCH Performed at [facility], [address], [place], [place] [address]    Special Requests   Final    NONE Performed at [facility], [address] [place], [place] [address]    Culture >=100,000 COLONIES/mL PROTEUS MIRABILIS (A)  Final   Report Status 01/13/2023 FINAL  Final   Organism ID, Bacteria PROTEUS MIRABILIS (A)  Final      Susceptibility   Proteus mirabilis - MIC*    AMPICILLIN <=2 SENSITIVE Sensitive     CEFAZOLIN <=4 SENSITIVE Sensitive     CEFEPIME <=0.12 SENSITIVE Sensitive     CEFTRIAXONE <=0.25 SENSITIVE Sensitive     CIPROFLOXACIN <=0.25 SENSITIVE Sensitive     GENTAMICIN <=1 SENSITIVE Sensitive     IMIPENEM 4 SENSITIVE Sensitive     NITROFURANTOIN 128 RESISTANT Resistant     TRIMETH/SULFA <=20 SENSITIVE Sensitive     AMPICILLIN/SULBACTAM <=2 SENSITIVE Sensitive     PIP/TAZO <=4 SENSITIVE Sensitive     * >=100,000 COLONIES/mL PROTEUS MIRABILIS    Radiology Studies: No results found.    Yanil Dawe T. Mikyle Sox [facility]  If 7PM-7AM, please contact night-coverage [email or web address] 01/14/2023, 1:02 PM

## 2023-01-14 NOTE — Progress Notes (Signed)
Neurology progress note  S: Patient is on her fourth day of steroids.  She reports some improvement in her lower extremity numbness and weakness.  O:  Vitals:   01/14/23 0318 01/14/23 0730  BP: (!) 145/81 110/75  Pulse: (!) 48 (!) 51  Resp: 16 14  Temp: 97.9 F (36.6 C) 98 F (36.7 C)  SpO2: 99% 100%    Exam  General: Laying comfortably in bed; in no acute distress.  HENT: Normal oropharynx and mucosa. Normal external appearance of ears and nose.  Neck: Supple, no pain or tenderness  CV: No JVD. No peripheral edema.  Pulmonary: Symmetric Chest rise. Normal respiratory effort.  Abdomen: Soft to touch, non-tender.  Ext: No cyanosis, edema, or deformity  Skin: No rash. Normal palpation of skin.   Musculoskeletal: Normal digits and nails by inspection. No clubbing.    Neurologic Examination  Mental status/Cognition: Alert, oriented to self, place, month and year, good attention.  Speech/language: Fluent, comprehension intact.  Cranial nerves:   CN II Pupils equal and reactive to light   CN III,IV,VI EOM intact, no gaze preference or deviation, no nystagmus    CN V normal sensation in V1, V2, and V3 segments bilaterally    CN VII no asymmetry, no nasolabial fold flattening    CN VIII normal hearing to speech    CN IX & X Phonation normal   CN XI    CN XII midline tongue protrusion     Motor:  Muscle bulk: normal, tone normal, tremor none Mvmt Root Nerve  Muscle Right Left Comments  SA C5/6 Ax Deltoid 5 5    EF C5/6 Mc Biceps 5 5    EE C6/7/8 Rad Triceps 5 5    WF C6/7 Med FCR        WE C7/8 PIN ECU        F Ab C8/T1 U ADM/FDI 5 5    HF L1/2/3 Fem Illopsoas 5 4    KE L2/3/4 Fem Quad 5 5    DF L4/5 D Peron Tib Ant 5 5    PF S1/2 Tibial Grc/Sol 5 5      Reflexes:   Right Left Comments  Pectoralis         Biceps (C5/6) 2 2    Brachioradialis (C5/6) 2 2     Triceps (C6/7) 2 2     Patellar (L3/4) 2 2     Achilles (S1) 1 1     Hoffman +   +     Plantar      Jaw  jerk       Sensation:  Light touch Paresthesias in LLE to light touch   Pin prick     Temperature     Vibration    Proprioception      Coordination/Complex Motor:  - Gait: deferred for patient safety, but patient states she has been ambulating with walker  Data  Vitamin D level 6.03 NMO-IgG less than 1.5 MOG antibody negative  MR Brain, C, T, L spine w + w/o C(personally reviewed): Several T2/FLAIR lesions, patterns more consistent with demyelinating disease rather than small vessel. Has T2/FLAIR lesion with enhancement at T7-8 level consistent with active demyelination.  A/P: Dana Woods is a 22 y.o. female with PMH significant for HLD, obesity, who presents with 4 day hx of LLE weakness and numbness. Found to have Several T2/FLAIR lesions, patterns more consistent with demyelinating disease rather than small vessel. Has T2/FLAIR lesion with enhancement at  T7-8 level consistent with active demyelination. She now fits the revised Mcdonald critera for MS. Her neurologic examination is notable for mild LLE weakness and paresthesias. This is starting to improve on solumedrol.  On 5/25, patient reports continued improvement with additional doses of Solu-Medrol  # New diagnosis of Multiple Sclerosis with current flare - IV solumedrol 1000mg  daily x 5 doses, last dose tomorrow - PPI while on steroids -Supplement vitamin D 50,000 units/week x 6 to 8 weeks, then per primary care - PT and OT. - follow up with Dr. Despina Arias with [facility] - Will continue to follow  Patient seen by NP and MD, MD to edit note is needed Natalio Salois E Ernestina Columbia , MSN, AGACNP-BC [facility] See Amion for schedule and pager information 01/14/2023 11:14 AM   Attending Neurohospitalist Addendum Patient seen and examined with APP/Resident. Agree with the history and physical as documented above. Agree with the plan as documented, which I helped formulate. I have independently reviewed the  chart, obtained history, review of systems and examined the patient.I have personally reviewed pertinent head/neck/spine imaging (CT/MRI). Please feel free to call with any questions.  -- Milon Dikes, MD Neurologist [facility] Pager: [phone]

## 2023-01-14 NOTE — Evaluation (Signed)
Occupational Therapy Evaluation Patient Details Name: Dana Woods MRN: [id number] DOB: 19-Aug-2001 Today's Date: 01/14/2023   History of Present Illness Pt is a 22 y.o. female who presented 01/11/23 with LLE weakness and numbness. Imaging revealed multiple periventricular and subcortical T2/FLAIR hyperintense lesions, including at the callososeptal interface, and T2 hyperintense lesion at the level of the T7-T8 disc space. Pt now fits the revised Mcdonald criteria for MS. PMH: HLD, obesity, asthma   Clinical Impression   Prior to this admission, patient independent in ADLs and IADLs, driving, and working 3rd shift at [employer]. Currently patient is close to her baseline. Patient is able to ambulate and take a shower independently with, education provided with regard to energy conservation and memory aides as patient is endorsing a minimal increase in difficult with recent memory recall. Patient however acknowleding that her diagnosis is new and is a lot to process. Patient with no sensory deficits or weakness in BUEs, and vision is WNL. OT signing off at this time, as this patient has no further acute needs. Please re-consult if further acute needs arise.      Recommendations for follow up therapy are one component of a multi-disciplinary discharge planning process, led by the attending physician.  Recommendations may be updated based on patient status, additional functional criteria and insurance authorization.   Assistance Recommended at Discharge PRN  Patient can return home with the following      Functional Status Assessment  Patient has had a recent decline in their functional status and demonstrates the ability to make significant improvements in function in a reasonable and predictable amount of time.  Equipment Recommendations  None recommended by OT    Recommendations for Other Services       Precautions / Restrictions Precautions Precautions: Fall (low  risk) Restrictions Weight Bearing Restrictions: No      Mobility Bed Mobility Overal bed mobility: Modified Independent             General bed mobility comments: HOB elevated, no assistance needed    Transfers Overall transfer level: Independent Equipment used: None               General transfer comment: No assistance needed, no LOB      Balance Overall balance assessment: Mild deficits observed, not formally tested                                         ADL either performed or assessed with clinical judgement   ADL Overall ADL's : At baseline                                       General ADL Comments: Patient is able to ambulate and take a shower independently, education provided with regard to energy conservation and memory aides as patient is endorsing a minimal increase in difficult with recent memory recall. Patient however acknowleding that her diagnosis is new and is a lot to process.     Vision Baseline Vision/History: 1 Wears glasses Ability to See in Adequate Light: 0 Adequate Patient Visual Report: No change from baseline Vision Assessment?: Yes Eye Alignment: Within Functional Limits Ocular Range of Motion: Within Functional Limits Alignment/Gaze Preference: Within Defined Limits Tracking/Visual Pursuits: Able to track stimulus in all quads without difficulty Saccades: Within functional limits  Convergence: Within functional limits Visual Fields: No apparent deficits     Perception     Praxis      Pertinent Vitals/Pain Pain Assessment Pain Assessment: No/denies pain Faces Pain Scale: No hurt     Hand Dominance Right   Extremity/Trunk Assessment Upper Extremity Assessment Upper Extremity Assessment: Overall WFL for tasks assessed   Lower Extremity Assessment Lower Extremity Assessment: Defer to PT evaluation   Cervical / Trunk Assessment Cervical / Trunk Assessment: Normal   Communication  Communication Communication: No difficulties   Cognition Arousal/Alertness: Awake/alert Behavior During Therapy: WFL for tasks assessed/performed Overall Cognitive Status: Within Functional Limits for tasks assessed                                 General Comments: Able to complete 3 word recall, states a little hard time remembering things lately, strategies provided (use a calendar, memory aides etc) to promote increased recall     General Comments  educated on MS symptoms, energy conservation, memory aides, and working toward maintaining a positive mental attitude amidst a new diagnosis    Exercises     Shoulder Instructions      Home Living Family/patient expects to be discharged to:: Private residence Living Arrangements: Parent (mother) Available Help at Discharge: Family;Available 24 hours/day (mother works 3rd shift, sister can stay if needed) Type of Home: Apartment Home Access: Stairs to enter Secretary/administrator of Steps: flight Entrance Stairs-Rails: Can reach both Home Layout: One level     Bathroom Shower/Tub: Chief Strategy Officer: Standard     Home Equipment: None          Prior Functioning/Environment Prior Level of Function : Independent/Modified Independent;Driving;Working/employed             Mobility Comments: No AD ADLs Comments: Works standing on her feet long periods of time at [employer]        OT Problem List: Decreased activity tolerance      OT Treatment/Interventions:      OT Goals(Current goals can be found in the care plan section) Acute Rehab OT Goals Patient Stated Goal: to get this diagnosis figured out OT Goal Formulation: With patient Time For Goal Achievement: 01/28/23 Potential to Achieve Goals: Good  OT Frequency:      Co-evaluation              AM-PAC OT "6 Clicks" Daily Activity     Outcome Measure Help from another person eating meals?: None Help from another person  taking care of personal grooming?: None Help from another person toileting, which includes using toliet, bedpan, or urinal?: None Help from another person bathing (including washing, rinsing, drying)?: None Help from another person to put on and taking off regular upper body clothing?: None Help from another person to put on and taking off regular lower body clothing?: None 6 Click Score: 24   End of Session Nurse Communication: Mobility status  Activity Tolerance: Patient tolerated treatment well Patient left: in bed;with call bell/phone within reach  OT Visit Diagnosis: Unsteadiness on feet (R26.81)                Time: 1610-9604 OT Time Calculation (min): 14 min Charges:  OT General Charges $OT Visit: 1 Visit OT Evaluation $OT Eval Moderate Complexity: 1 Mod  Pollyann Glen E. Yovani Cogburn, OTR/L Acute Rehabilitation Services [phone]   Cherlyn Cushing 01/14/2023, 1:54 PM

## 2023-01-15 DIAGNOSIS — E7800 Pure hypercholesterolemia, unspecified: Secondary | ICD-10-CM | POA: Diagnosis not present

## 2023-01-15 DIAGNOSIS — G35 Multiple sclerosis: Secondary | ICD-10-CM | POA: Diagnosis not present

## 2023-01-15 DIAGNOSIS — F322 Major depressive disorder, single episode, severe without psychotic features: Secondary | ICD-10-CM | POA: Diagnosis not present

## 2023-01-15 DIAGNOSIS — E559 Vitamin D deficiency, unspecified: Secondary | ICD-10-CM

## 2023-01-15 MED ORDER — SODIUM CHLORIDE 0.9 % IV SOLN
1000.0000 mg | INTRAVENOUS | Status: AC
  Administered 2023-01-15: 1000 mg via INTRAVENOUS
  Filled 2023-01-15: qty 16

## 2023-01-15 MED ORDER — PANTOPRAZOLE SODIUM 40 MG IV SOLR: 40.0000 mg | INTRAVENOUS | Status: DC

## 2023-01-15 MED ORDER — VITAMIN D (ERGOCALCIFEROL) 1.25 MG (50000 UNIT) PO CAPS: 50000.0000 [IU] | ORAL_CAPSULE | ORAL | 0 refills | Status: DC

## 2023-01-15 MED ORDER — AMOXICILLIN 500 MG PO CAPS
500.0000 mg | ORAL_CAPSULE | Freq: Two times a day (BID) | ORAL | Status: DC
  Administered 2023-01-15: 500 mg via ORAL
  Filled 2023-01-15: qty 1

## 2023-01-15 NOTE — Progress Notes (Signed)
Neurology progress note  S: Patient has just finished her fifth dose of steroids.  She feels ready for discharge and is waiting for her rolling walker to be delivered.  O:  Vitals:   01/15/23 0324 01/15/23 0819  BP: (!) 139/94 (!) 148/92  Pulse: (!) 48 (!) 50  Resp: 17 16  Temp: 97.9 F (36.6 C) 97.7 F (36.5 C)  SpO2: 100% 100%    Exam  General: Laying comfortably in bed; in no acute distress.  HENT: Normal oropharynx and mucosa. Normal external appearance of ears and nose.  Neck: Supple, no pain or tenderness  CV: No JVD. No peripheral edema.  Pulmonary: Symmetric Chest rise. Normal respiratory effort.  Abdomen: Soft to touch, non-tender.  Ext: No cyanosis, edema, or deformity  Skin: No rash. Normal palpation of skin.   Musculoskeletal: Normal digits and nails by inspection. No clubbing.    Neurologic Examination  Mental status/Cognition: Alert, oriented to self, place, month and year, good attention.  Speech/language: Fluent, comprehension intact.  Cranial nerves:   CN II Pupils equal and reactive to light   CN III,IV,VI EOM intact, no gaze preference or deviation, no nystagmus    CN V normal sensation in V1, V2, and V3 segments bilaterally    CN VII no asymmetry, no nasolabial fold flattening    CN VIII normal hearing to speech    CN IX & X Phonation normal   CN XI Shoulder shrug symmetrical   CN XII midline tongue protrusion     Motor:  Muscle bulk: normal, tone normal, tremor none Mvmt Root Nerve  Muscle Right Left Comments  SA C5/6 Ax Deltoid 5 5    EF C5/6 Mc Biceps 5 5    EE C6/7/8 Rad Triceps 5 5    WF C6/7 Med FCR        WE C7/8 PIN ECU        F Ab C8/T1 U ADM/FDI 5 5    HF L1/2/3 Fem Illopsoas 5 5    KE L2/3/4 Fem Quad 5 5    DF L4/5 D Peron Tib Ant 5 5    PF S1/2 Tibial Grc/Sol 5 5      Reflexes:   Right Left Comments  Pectoralis         Biceps (C5/6) 2 2    Brachioradialis (C5/6) 2 2     Triceps (C6/7)       Patellar (L3/4) 2 2     Achilles  (S1)       Hoffman +   +     Plantar      Jaw jerk       Sensation:  Light touch Intact to light touch throughout with no sensory deficit noted in LLE   Pin prick     Temperature     Vibration    Proprioception      Coordination/Complex Motor:  - Gait: deferred for patient safety, but patient states she has been ambulating with walker  Data  Vitamin D level 6.03 NMO-IgG less than 1.5 MOG antibody negative  MR Brain, C, T, L spine w + w/o C(personally reviewed): Several T2/FLAIR lesions, patterns more consistent with demyelinating disease rather than small vessel. Has T2/FLAIR lesion with enhancement at T7-8 level consistent with active demyelination.  A/P: Dana Woods is a 22 y.o. female with PMH significant for HLD, obesity, who presents with 4 day hx of LLE weakness and numbness. Found to have Several T2/FLAIR lesions, patterns more  consistent with demyelinating disease rather than small vessel. Has T2/FLAIR lesion with enhancement at T7-8 level consistent with active demyelination. She now fits the revised Mcdonald critera for MS. Her neurologic examination is notable for mild LLE weakness and paresthesias. This is starting to improve on solumedrol.  On 5/25, patient reports continued improvement with additional doses of Solu-Medrol.  On 5/26, patient reports that symptoms are much better and that she has a follow-up appointment scheduled.  # New diagnosis of Multiple Sclerosis with current flare - s/p 5 doses of IV solumedrol 1000mg   -Supplement vitamin D 50,000 units/week x 6 to 8 weeks, then per primary care - PT and OT. - follow up with Dr. Despina Arias with [facility], appointment scheduled  Patient seen by NP and MD, MD to edit note is needed Cortney E Ernestina Columbia , MSN, AGACNP-BC [facility] See Amion for schedule and pager information 01/15/2023 10:03 AM  Attending Neurohospitalist Addendum Patient was discharged prior to me being able to see the  patient.  She was seen and examined by C.[place] la [place] Corona Agree with the history and physical as documented above. Agree with the plan as documented, which I helped formulate. I have independently reviewed the chart, obtained history, review of systems and examined the patient.I have personally reviewed pertinent head/neck/spine imaging (CT/MRI). Please feel free to call with any questions.  -- Milon Dikes, MD Neurologist [facility] Pager: [phone]  No charge note

## 2023-01-15 NOTE — Discharge Summary (Signed)
Physician Discharge Summary  Dana Woods:[id number] DOB: [birth year] DOA: 01/11/2023  PCP: Pcp, No  Admit date: 01/11/2023 Discharge date: 01/15/2023 Admitted From: Home Disposition: Home Recommendations for Outpatient Follow-up:  Follow up with PCP and neurology as below Please follow up on the following pending results: None  Home Health: None Equipment/Devices: Rollator  Discharge Condition: Stable CODE STATUS: Full code  Follow-up Information     Sater, Pearletha Furl, MD Follow up.   Specialty: Neurology Why: follow up Contact information: [address] [place] [address] [phone]         [facility] Follow up on 01/30/2023.   Specialty: Family Medicine Why: 9:30 am   Please arrive 15 minutes early, if you need to reschedule or cancel please call within 24 hours of scheduled appointment Contact information: [address] [place] [facility] [address] [phone]        [facility] Follow up.   Why: Your medical equiptment vendor                Hospital course 22 year old F with PMH of asthma, depression, HLD and morbid obesity presenting with right lower extremity weakness and numbness after her waist, and found to have new diagnosis of multiple sclerosis with flareup. MRI Brain, C, T and L-spine showed, C spine, T spine and L spine multiple periventricular and subcortical T2/FLAIR hyperintense lesions, including at the callososeptal interface without evidence of active demyelination in the brain. T2 hyperintense lesion at the level of the T7-T8 disc space, which likely demonstrates contrast enhancement worrisome for an active demyelinating lesion.  Neurology consulted and started high-dose Solu-Medrol.  NMO or panel, MOG antibody and vitamin D levels pending.  Also started on ceftriaxone for possible UTI.     Neuro symptoms improved with high-dose Solu-Medrol, and she completed 5 days course on 5/26  and cleared for discharge by neurology for outpatient follow-up.    Urine culture with pansensitive Proteus mirabilis except resistance to Macrobid.  Antibiotic de-escalated to p.o. amoxicillin.  She completed course in-house.  Patient is discharged on vitamin D 50,000 international unit weekly for vitamin D deficiency  See individual problem list below for more.   Problems addressed during this hospitalization Principal Problem:   Multiple sclerosis with acute neurologic event (HCC) Active Problems:   Morbid obesity (HCC)   Hypercholesteremia   Current severe episode of major depressive disorder without psychotic features without prior episode (HCC)   Vitamin D deficiency   Right lower extremity numbness and weakness: New diagnosis of multiple sclerosis with flare: NMO and MGO ab negative. -MRI finding as above -Neurology recs:             -Completed IV solumedrol 1g daily x 5 doses on 5/26 -Follow up with Dr. Despina Arias with [facility]. -Rollator ordered as recommended by therapy   Bacteriuria with pyuria: UA with pyuria and bacteria but patient denies UTI symptoms.  Started on ceftriaxone.  Urine culture with pansensitive Proteus mirabilis except resistant to Macrobid. -Received ceftriaxone x 2.  Complete antibiotic course with amoxicillin   Vitamin D deficiency: Vitamin D level 6.0. -Vitamin D 50,000 units weekly x 7.  Started on -Recheck level after she completes the course   Hyperlipidemia: LDL 119.  Prior history of CAD, stroke or diabetes. -Encourage lifestyle change to lose weight   Major depression: Stable.  Seems to be off Prozac.   Mild intermittent asthma: Stable.  Does not seem to be on medication.  Morbid obesity Body mass index is 45.19 kg/m. A1c 5.0%.  HDL 51.  LDL 119. -Encourage lifestyle change to lose weight.           Time spent 35 minutes  Vital signs Vitals:   01/14/23 2356 01/15/23 0324 01/15/23 0819 01/15/23 1126  BP: (!)  124/91 (!) 139/94 (!) 148/92 118/72  Pulse: 61 (!) 48 (!) 50 67  Temp: 97.8 F (36.6 C) 97.9 F (36.6 C) 97.7 F (36.5 C) 97.9 F (36.6 C)  Resp: 16 17 16 18   Height:      Weight:      SpO2: 97% 100% 100% 100%  TempSrc: Oral Oral Oral Oral     Discharge exam  GENERAL: No apparent distress.  Nontoxic. HEENT: MMM.  Vision and hearing grossly intact.  NECK: Supple.  No apparent JVD.  RESP:  No IWOB.  Fair aeration bilaterally. CVS:  RRR. Heart sounds normal.  ABD/GI/GU: BS+. Abd soft, NTND.  MSK/EXT:  Moves extremities. No apparent deformity. No edema.  SKIN: no apparent skin lesion or wound NEURO: Awake and alert. Oriented appropriately.  No apparent focal neuro deficit. PSYCH: Calm. Normal affect.   Discharge Instructions Discharge Instructions     Diet general   Complete by: As directed    Discharge instructions   Complete by: As directed    It has been a pleasure taking care of you!  You were hospitalized due to leg weakness and numbness likely from multiple sclerosis for which you have been treated with a steroid.  Follow-up with your primary care doctor and neurology in 1 to 2 weeks or sooner.    Take care,   Increase activity slowly   Complete by: As directed       Allergies as of 01/15/2023       Reactions   Gadavist [gadobutrol] Nausea Only   Nausea right after contrast was given. No other reaction.    Nickel Other (See Comments)        Medication List     STOP taking these medications    oxyCODONE 5 MG immediate release tablet Commonly known as: Oxy IR/ROXICODONE       TAKE these medications    Norethindrone Acetate-Ethinyl Estrad-FE 1-20 MG-MCG(24) tablet Commonly known as: LOESTRIN 24 FE Take 1 tablet by mouth daily.   polyethylene glycol 17 g packet Commonly known as: MiraLax Take 17 g by mouth daily.   Vitamin D (Ergocalciferol) 1.25 MG (50000 UNIT) Caps capsule Commonly known as: DRISDOL Take 1 capsule (50,000 Units total) by  mouth every 7 (seven) days. Start taking on: Jan 19, 2023               Durable Medical Equipment  (From admission, onward)           Start     Ordered   01/15/23 0858  For home use only DME 4 wheeled rolling walker with seat  Once       Question:  Patient needs a walker to treat with the following condition  Answer:  Weakness   01/15/23 0857   01/13/23 1248  For home use only DME Walker rolling  Once       Comments: Rolling walker  with 4 wheels and seat weakness due to MS  Question Answer Comment  Walker: Other   Comments RW with 4 wheels and seat   Patient needs a walker to treat with the following condition Weakness      01/13/23 1249  Consultations: Neurology  Procedures/Studies:   MR Brain W and Wo Contrast  Result Date: 01/11/2023 CLINICAL DATA:  Neuro deficit, acute, stroke suspected; Myelopathy, chronic, thoracic spine; Myelopathy, acute, lumbar spine; Myelopathy, acute, cervical spine EXAM: MRI HEAD WITHOUT AND WITH CONTRAST MRI CERVICAL SPINE WITHOUT AND WITH CONTRAST MRI THORACIC SPINE WITHOUT AND CONTRAST MRI LUMBAR SPINE WITHOUT AND CONTRAST CONTRAST:  10mL GADAVIST GADOBUTROL 1 MMOL/ML IV SOLN TECHNIQUE: Multiplanar, multiecho pulse sequences of the brain and surrounding structures, and cervical and thoracic and lumbar spine were obtained without and with intravenous contrast. COMPARISON:  None Available. FINDINGS: MRI HEAD FINDINGS Brain: Negative for an acute infarct. There are multiple periventricular and subcortical T2/FLAIR hyperintense lesions,including at the callososeptal interface. These are worrisome for demyelinating lesions. There is no evidence of true diffusion restriction or contrast enhancement to definitively suggest active demyelination. T2 hyperintense lesions are also present in the pons. No hemorrhage. No extra-axial fluid collection. No hydrocephalus. Vascular: Normal flow voids. Skull and upper cervical spine: Normal marrow  signal. Sinuses/Orbits: No middle ear or mastoid effusion. Paranasal sinuses are clear. Orbits are unremarkable. Note that assessment of the optic nerves is limited due to technique. Other: None. MRI CERVICAL SPINE FINDINGS Alignment: Physiologic. Vertebrae: No fracture, evidence of discitis, or bone lesion. Cord: The cord is normal in caliber. There is no convincing lesion in the cervical spine to definitively suggest demyelinating disease. No evidence of contrast enhancement to suggest active demyelination. Posterior Fossa, vertebral arteries, paraspinal tissues: Negative. Disc levels: No evidence of high-grade spinal canal stenosis. MRI THORACIC SPINE FINDINGS Alignment:  Physiologic. Vertebrae: No fracture, evidence of discitis, or bone lesion. Cord: There is a T2 hyperintense lesion at the level of the T7-T8 disc space (series 19, image 10). This lesion likely demonstrates contrast enhancement (series 23, image 11). Paraspinal and other soft tissues: Negative. Disc levels: No evidence of high-grade spinal canal or neural foraminal stenosis. MRI LUMBAR SPINE FINDINGS Segmentation:  Standard. Alignment:  Physiologic. Vertebrae:  No fracture, evidence of discitis, or bone lesion. Conus medullaris and cauda equina: Conus extends to the L1-L2 disc space level. Conus and cauda equina appear normal. Paraspinal and other soft tissues: Negative. Disc levels: No evidence of high-grade spinal canal or neural foraminal stenosis. IMPRESSION: 1. Multiple periventricular and subcortical T2/FLAIR hyperintense lesions, including at the callososeptal interface. Findings are worrisome for demyelinating disease (Multiple sclerosis). No evidence of active demyelination in the brain. 2. T2 hyperintense lesion at the level of the T7-T8 disc space, which likely demonstrates contrast enhancement. This is worrisome for an active demyelinating lesion. 3. No convincing lesion in the spine to definitively suggest demyelinating disease. 4.  Normal appearance of the lumbar spine. Electronically Signed   By: Lorenza Cambridge M.D.   On: 01/11/2023 13:18   MR Cervical Spine W and Wo Contrast  Result Date: 01/11/2023 CLINICAL DATA:  Neuro deficit, acute, stroke suspected; Myelopathy, chronic, thoracic spine; Myelopathy, acute, lumbar spine; Myelopathy, acute, cervical spine EXAM: MRI HEAD WITHOUT AND WITH CONTRAST MRI CERVICAL SPINE WITHOUT AND WITH CONTRAST MRI THORACIC SPINE WITHOUT AND CONTRAST MRI LUMBAR SPINE WITHOUT AND CONTRAST CONTRAST:  10mL GADAVIST GADOBUTROL 1 MMOL/ML IV SOLN TECHNIQUE: Multiplanar, multiecho pulse sequences of the brain and surrounding structures, and cervical and thoracic and lumbar spine were obtained without and with intravenous contrast. COMPARISON:  None Available. FINDINGS: MRI HEAD FINDINGS Brain: Negative for an acute infarct. There are multiple periventricular and subcortical T2/FLAIR hyperintense lesions,including at the callososeptal interface. These are worrisome for demyelinating lesions. There  is no evidence of true diffusion restriction or contrast enhancement to definitively suggest active demyelination. T2 hyperintense lesions are also present in the pons. No hemorrhage. No extra-axial fluid collection. No hydrocephalus. Vascular: Normal flow voids. Skull and upper cervical spine: Normal marrow signal. Sinuses/Orbits: No middle ear or mastoid effusion. Paranasal sinuses are clear. Orbits are unremarkable. Note that assessment of the optic nerves is limited due to technique. Other: None. MRI CERVICAL SPINE FINDINGS Alignment: Physiologic. Vertebrae: No fracture, evidence of discitis, or bone lesion. Cord: The cord is normal in caliber. There is no convincing lesion in the cervical spine to definitively suggest demyelinating disease. No evidence of contrast enhancement to suggest active demyelination. Posterior Fossa, vertebral arteries, paraspinal tissues: Negative. Disc levels: No evidence of high-grade spinal  canal stenosis. MRI THORACIC SPINE FINDINGS Alignment:  Physiologic. Vertebrae: No fracture, evidence of discitis, or bone lesion. Cord: There is a T2 hyperintense lesion at the level of the T7-T8 disc space (series 19, image 10). This lesion likely demonstrates contrast enhancement (series 23, image 11). Paraspinal and other soft tissues: Negative. Disc levels: No evidence of high-grade spinal canal or neural foraminal stenosis. MRI LUMBAR SPINE FINDINGS Segmentation:  Standard. Alignment:  Physiologic. Vertebrae:  No fracture, evidence of discitis, or bone lesion. Conus medullaris and cauda equina: Conus extends to the L1-L2 disc space level. Conus and cauda equina appear normal. Paraspinal and other soft tissues: Negative. Disc levels: No evidence of high-grade spinal canal or neural foraminal stenosis. IMPRESSION: 1. Multiple periventricular and subcortical T2/FLAIR hyperintense lesions, including at the callososeptal interface. Findings are worrisome for demyelinating disease (Multiple sclerosis). No evidence of active demyelination in the brain. 2. T2 hyperintense lesion at the level of the T7-T8 disc space, which likely demonstrates contrast enhancement. This is worrisome for an active demyelinating lesion. 3. No convincing lesion in the spine to definitively suggest demyelinating disease. 4. Normal appearance of the lumbar spine. Electronically Signed   By: Lorenza Cambridge M.D.   On: 01/11/2023 13:18   MR THORACIC SPINE W WO CONTRAST  Result Date: 01/11/2023 CLINICAL DATA:  Neuro deficit, acute, stroke suspected; Myelopathy, chronic, thoracic spine; Myelopathy, acute, lumbar spine; Myelopathy, acute, cervical spine EXAM: MRI HEAD WITHOUT AND WITH CONTRAST MRI CERVICAL SPINE WITHOUT AND WITH CONTRAST MRI THORACIC SPINE WITHOUT AND CONTRAST MRI LUMBAR SPINE WITHOUT AND CONTRAST CONTRAST:  10mL GADAVIST GADOBUTROL 1 MMOL/ML IV SOLN TECHNIQUE: Multiplanar, multiecho pulse sequences of the brain and surrounding  structures, and cervical and thoracic and lumbar spine were obtained without and with intravenous contrast. COMPARISON:  None Available. FINDINGS: MRI HEAD FINDINGS Brain: Negative for an acute infarct. There are multiple periventricular and subcortical T2/FLAIR hyperintense lesions,including at the callososeptal interface. These are worrisome for demyelinating lesions. There is no evidence of true diffusion restriction or contrast enhancement to definitively suggest active demyelination. T2 hyperintense lesions are also present in the pons. No hemorrhage. No extra-axial fluid collection. No hydrocephalus. Vascular: Normal flow voids. Skull and upper cervical spine: Normal marrow signal. Sinuses/Orbits: No middle ear or mastoid effusion. Paranasal sinuses are clear. Orbits are unremarkable. Note that assessment of the optic nerves is limited due to technique. Other: None. MRI CERVICAL SPINE FINDINGS Alignment: Physiologic. Vertebrae: No fracture, evidence of discitis, or bone lesion. Cord: The cord is normal in caliber. There is no convincing lesion in the cervical spine to definitively suggest demyelinating disease. No evidence of contrast enhancement to suggest active demyelination. Posterior Fossa, vertebral arteries, paraspinal tissues: Negative. Disc levels: No evidence of high-grade spinal  canal stenosis. MRI THORACIC SPINE FINDINGS Alignment:  Physiologic. Vertebrae: No fracture, evidence of discitis, or bone lesion. Cord: There is a T2 hyperintense lesion at the level of the T7-T8 disc space (series 19, image 10). This lesion likely demonstrates contrast enhancement (series 23, image 11). Paraspinal and other soft tissues: Negative. Disc levels: No evidence of high-grade spinal canal or neural foraminal stenosis. MRI LUMBAR SPINE FINDINGS Segmentation:  Standard. Alignment:  Physiologic. Vertebrae:  No fracture, evidence of discitis, or bone lesion. Conus medullaris and cauda equina: Conus extends to the  L1-L2 disc space level. Conus and cauda equina appear normal. Paraspinal and other soft tissues: Negative. Disc levels: No evidence of high-grade spinal canal or neural foraminal stenosis. IMPRESSION: 1. Multiple periventricular and subcortical T2/FLAIR hyperintense lesions, including at the callososeptal interface. Findings are worrisome for demyelinating disease (Multiple sclerosis). No evidence of active demyelination in the brain. 2. T2 hyperintense lesion at the level of the T7-T8 disc space, which likely demonstrates contrast enhancement. This is worrisome for an active demyelinating lesion. 3. No convincing lesion in the spine to definitively suggest demyelinating disease. 4. Normal appearance of the lumbar spine. Electronically Signed   By: Lorenza Cambridge M.D.   On: 01/11/2023 13:18   MR Lumbar Spine W Wo Contrast  Result Date: 01/11/2023 CLINICAL DATA:  Neuro deficit, acute, stroke suspected; Myelopathy, chronic, thoracic spine; Myelopathy, acute, lumbar spine; Myelopathy, acute, cervical spine EXAM: MRI HEAD WITHOUT AND WITH CONTRAST MRI CERVICAL SPINE WITHOUT AND WITH CONTRAST MRI THORACIC SPINE WITHOUT AND CONTRAST MRI LUMBAR SPINE WITHOUT AND CONTRAST CONTRAST:  10mL GADAVIST GADOBUTROL 1 MMOL/ML IV SOLN TECHNIQUE: Multiplanar, multiecho pulse sequences of the brain and surrounding structures, and cervical and thoracic and lumbar spine were obtained without and with intravenous contrast. COMPARISON:  None Available. FINDINGS: MRI HEAD FINDINGS Brain: Negative for an acute infarct. There are multiple periventricular and subcortical T2/FLAIR hyperintense lesions,including at the callososeptal interface. These are worrisome for demyelinating lesions. There is no evidence of true diffusion restriction or contrast enhancement to definitively suggest active demyelination. T2 hyperintense lesions are also present in the pons. No hemorrhage. No extra-axial fluid collection. No hydrocephalus. Vascular: Normal  flow voids. Skull and upper cervical spine: Normal marrow signal. Sinuses/Orbits: No middle ear or mastoid effusion. Paranasal sinuses are clear. Orbits are unremarkable. Note that assessment of the optic nerves is limited due to technique. Other: None. MRI CERVICAL SPINE FINDINGS Alignment: Physiologic. Vertebrae: No fracture, evidence of discitis, or bone lesion. Cord: The cord is normal in caliber. There is no convincing lesion in the cervical spine to definitively suggest demyelinating disease. No evidence of contrast enhancement to suggest active demyelination. Posterior Fossa, vertebral arteries, paraspinal tissues: Negative. Disc levels: No evidence of high-grade spinal canal stenosis. MRI THORACIC SPINE FINDINGS Alignment:  Physiologic. Vertebrae: No fracture, evidence of discitis, or bone lesion. Cord: There is a T2 hyperintense lesion at the level of the T7-T8 disc space (series 19, image 10). This lesion likely demonstrates contrast enhancement (series 23, image 11). Paraspinal and other soft tissues: Negative. Disc levels: No evidence of high-grade spinal canal or neural foraminal stenosis. MRI LUMBAR SPINE FINDINGS Segmentation:  Standard. Alignment:  Physiologic. Vertebrae:  No fracture, evidence of discitis, or bone lesion. Conus medullaris and cauda equina: Conus extends to the L1-L2 disc space level. Conus and cauda equina appear normal. Paraspinal and other soft tissues: Negative. Disc levels: No evidence of high-grade spinal canal or neural foraminal stenosis. IMPRESSION: 1. Multiple periventricular and subcortical T2/FLAIR hyperintense lesions,  including at the callososeptal interface. Findings are worrisome for demyelinating disease (Multiple sclerosis). No evidence of active demyelination in the brain. 2. T2 hyperintense lesion at the level of the T7-T8 disc space, which likely demonstrates contrast enhancement. This is worrisome for an active demyelinating lesion. 3. No convincing lesion in  the spine to definitively suggest demyelinating disease. 4. Normal appearance of the lumbar spine. Electronically Signed   By: Lorenza Cambridge M.D.   On: 01/11/2023 13:18       The results of significant diagnostics from this hospitalization (including imaging, microbiology, ancillary and laboratory) are listed below for reference.     Microbiology: Recent Results (from the past 240 hour(s))  Urine Culture     Status: Abnormal   Collection Time: 01/11/23  8:26 AM   Specimen: Urine, Clean Catch  Result Value Ref Range Status   Specimen Description   Final    URINE, CLEAN CATCH Performed at [facility], [address] [place], [place] [address]    Special Requests   Final    NONE Performed at [facility], [address], [place], [place] [address]    Culture >=100,000 COLONIES/mL PROTEUS MIRABILIS (A)  Final   Report Status 01/13/2023 FINAL  Final   Organism ID, Bacteria PROTEUS MIRABILIS (A)  Final      Susceptibility   Proteus mirabilis - MIC*    AMPICILLIN <=2 SENSITIVE Sensitive     CEFAZOLIN <=4 SENSITIVE Sensitive     CEFEPIME <=0.12 SENSITIVE Sensitive     CEFTRIAXONE <=0.25 SENSITIVE Sensitive     CIPROFLOXACIN <=0.25 SENSITIVE Sensitive     GENTAMICIN <=1 SENSITIVE Sensitive     IMIPENEM 4 SENSITIVE Sensitive     NITROFURANTOIN 128 RESISTANT Resistant     TRIMETH/SULFA <=20 SENSITIVE Sensitive     AMPICILLIN/SULBACTAM <=2 SENSITIVE Sensitive     PIP/TAZO <=4 SENSITIVE Sensitive     * >=100,000 COLONIES/mL PROTEUS MIRABILIS     Labs:  CBC: Recent Labs  Lab 01/11/23 0826 01/11/23 1700 01/12/23 0505  WBC 7.5 8.2 9.3  NEUTROABS 4.6  --   --   HGB 11.8* 11.4* 12.3  HCT 37.9 35.4* 37.6  MCV 89.4 88.1 86.2  PLT 290 307 262   BMP &GFR Recent Labs  Lab 01/11/23 0826 01/11/23 1700 01/12/23 0505  NA 136  --  138  K 3.8  --  4.2  CL 110  --  109  CO2 25  --  21*  GLUCOSE 81  --  135*  BUN 15  --  15  CREATININE 0.86  0.82 0.84  CALCIUM 8.8*  --  9.4  MG 2.2  --  2.1  PHOS  --   --  2.5   Estimated Creatinine Clearance: 140.9 mL/min (by C-G formula based on SCr of 0.84 mg/dL). Liver & Pancreas: Recent Labs  Lab 01/11/23 0826 01/12/23 0505  AST 15 18  ALT 14 16  ALKPHOS 79 83  BILITOT 0.6 0.4  PROT 7.7 6.9  ALBUMIN 3.9 3.3*   No results for input(s): "LIPASE", "AMYLASE" in the last 168 hours. No results for input(s): "AMMONIA" in the last 168 hours. Diabetic: No results for input(s): "HGBA1C" in the last 72 hours. Recent Labs  Lab 01/12/23 1118 01/12/23 1633 01/12/23 2125 01/13/23 0610 01/13/23 1120  GLUCAP 175* 129* 149* 114* 135*   Cardiac Enzymes: No results for input(s): "CKTOTAL", "CKMB", "CKMBINDEX", "TROPONINI" in the last 168 hours. No results for input(s): "PROBNP" in the  last 8760 hours. Coagulation Profile: No results for input(s): "INR", "PROTIME" in the last 168 hours. Thyroid Function Tests: No results for input(s): "TSH", "T4TOTAL", "FREET4", "T3FREE", "THYROIDAB" in the last 72 hours. Lipid Profile: No results for input(s): "CHOL", "HDL", "LDLCALC", "TRIG", "CHOLHDL", "LDLDIRECT" in the last 72 hours. Anemia Panel: No results for input(s): "VITAMINB12", "FOLATE", "FERRITIN", "TIBC", "IRON", "RETICCTPCT" in the last 72 hours. Urine analysis:    Component Value Date/Time   COLORURINE YELLOW 01/11/2023 0847   APPEARANCEUR CLOUDY (A) 01/11/2023 0847   LABSPEC 1.028 01/11/2023 0847   PHURINE 6.0 01/11/2023 0847   GLUCOSEU NEGATIVE 01/11/2023 0847   HGBUR MODERATE (A) 01/11/2023 0847   BILIRUBINUR NEGATIVE 01/11/2023 0847   BILIRUBINUR neg 03/20/2013 1041   KETONESUR 5 (A) 01/11/2023 0847   PROTEINUR 30 (A) 01/11/2023 0847   UROBILINOGEN 0.2 07/13/2019 1200   NITRITE NEGATIVE 01/11/2023 0847   LEUKOCYTESUR MODERATE (A) 01/11/2023 0847   Sepsis Labs: Invalid input(s): "PROCALCITONIN", "LACTICIDVEN"   SIGNED:  Almon Hercules, MD  [facility] 01/15/2023,  12:20 PM

## 2023-01-15 NOTE — TOC Transition Note (Signed)
Transition of Care [facility]) - CM/SW Discharge Note   Patient Details  Name: Dana Woods MRN: [id number] Date of Birth: 03/11/2001  Transition of Care [facility]) CM/SW Contact:  Lawerance Sabal, RN Phone Number: 01/15/2023, 9:00 AM   Clinical Narrative:     Spoke w patient and she states that she has not received rollator. Contacted [employer] who will deliver to room this morning, and corrected DME order from RW to 4 wheeled RW  Final next level of care: Home/Self Care Barriers to Discharge: No Barriers Identified   Patient Goals and CMS Choice      Discharge Placement                         Discharge Plan and Services Additional resources added to the After Visit Summary for     Discharge Planning Services: CM Consult            DME Arranged: Walker rolling with seat DME Agency: [employer] Date DME Agency Contacted: 01/15/23 Time DME Agency Contacted: 0900 Representative spoke with at DME Agency: Vaughan Basta            Social Determinants of Health (SDOH) Interventions SDOH Screenings   Food Insecurity: No Food Insecurity (01/11/2023)  Housing: Low Risk  (01/11/2023)  Transportation Needs: No Transportation Needs (01/11/2023)  Utilities: Not At Risk (01/11/2023)  Tobacco Use: High Risk (01/11/2023)     Readmission Risk Interventions     No data to display

## 2023-01-16 DIAGNOSIS — R531 Weakness: Secondary | ICD-10-CM | POA: Diagnosis not present

## 2023-01-27 NOTE — Telephone Encounter (Signed)
Called and left VM with pt on 6/7 @ 10:27am

## 2023-01-28 NOTE — Progress Notes (Unsigned)
[facility]  PATIENT: Dana Woods DOB: 07-07-2001  REFERRING DOCTOR OR PCP: Gwinda Passe, NP SOURCE: Patient, mother, notes from emergency rooms and admission, imaging and lab reports, MRI images personally reviewed.  MRI images were also shown to the patient and her mother  _________________________________   HISTORICAL  CHIEF COMPLAINT:  Chief Complaint  Patient presents with   Room 11    Pt is here with her Mother. Pt states that she has having numbness from the waist down and in her hands. Pt states that she has tingling sensations in her hands and sometimes in her feet.  Pt states that she is having more Fatigue than normal.     HISTORY OF PRESENT ILLNESS:  I had the pleasure of seeing your patient, Dana Woods, at [facility] for neurologic potation regarding her recent diagnosis of multiple sclerosis.  She is a 22 yo woman who had the onset of left leg numbness around Jan 09, 2023.   The numbness increased and went up to her lower abdomen over a couple days and weakness.  She was working overtime and iniitally felt she was just overdoing it.  Due to the increase in symptoms, she presented to the emergency room 01/11/2023  The numbness started in the foot and went up the leg to the pelvis.  The weakness was mild in the left leg.   She was treated with 5 days of IV Solu-Medrol with improvement of symptoms by discharge.   She improved further after discharge.      While in the hospital, she had a urine culture showing Proteus mirabilis treated with amoxicillin.    She denies any other episode of neurologic symptoms.     Currently, she feels gait is better but not to baseline.   She holds the rail on stairs going down.    Strength is now normal.  Numbness is nearly resolved.    She does note pain in her neck and shoulder.   She has more headaches.     She notes fatigue.  She is sleepy some during the day.    She falls asleep easily but wakes  up some nights. She does not snore or have OSA signs.  Denies depression or anxiety.     She denies change in cognition. SOmetimes she neds peope to repeat what they say.    Vitamin D deficiency was noted.  Imaging: MRI of the head 01/11/2023 showed multiple T2/FLAIR hyperintense foci in the periventricular, juxtacortical and deep white matter of the cerebral hemispheres.  Additional focus is noted in the left mid pons.  None of the foci enhance.  MRI of the cervical, thoracic and lumbar spine 01/11/2023 showed a T2 hyperintense focus adjacent to T7-T8 towards the left that enhanced with contrast.  The study was otherwise normal.  Other medical issues: Depression, asthma, hyperlipidemia  Laboratory: Vitamin D was markedly low at 6.  Anti-NMO antibody and anti-MOG antibodies were negative.  HIV was negative.  REVIEW OF SYSTEMS: Constitutional: No fevers, chills, sweats, or change in appetite Eyes: No visual changes, double vision, eye pain Ear, nose and throat: No hearing loss, ear pain, nasal congestion, sore throat Cardiovascular: No chest pain, palpitations Respiratory:  No shortness of breath at rest or with exertion.   No wheezes GastrointestinaI: No nausea, vomiting, diarrhea, abdominal pain, fecal incontinence Genitourinary:  No dysuria, urinary retention or frequency.  No nocturia. Musculoskeletal:  No neck pain, back pain Integumentary: No rash, pruritus, skin lesions  Neurological: as above Psychiatric: No depression at this time.  No anxiety Endocrine: No palpitations, diaphoresis, change in appetite, change in weigh or increased thirst Hematologic/Lymphatic:  No anemia, purpura, petechiae. Allergic/Immunologic: No itchy/runny eyes, nasal congestion, recent allergic reactions, rashes  ALLERGIES: Allergies  Allergen Reactions   Gadavist [Gadobutrol] Nausea Only    Nausea right after contrast was given. No other reaction.    Nickel Other (See Comments)    HOME  MEDICATIONS:  Current Outpatient Medications:    phentermine 37.5 MG capsule, Take 1 capsule (37.5 mg total) by mouth every morning., Disp: 30 capsule, Rfl: 5   Vitamin D, Ergocalciferol, (DRISDOL) 1.25 MG (50000 UNIT) CAPS capsule, Take 1 capsule (50,000 Units total) by mouth every 7 (seven) days., Disp: 6 capsule, Rfl: 0   Norethindrone Acetate-Ethinyl Estrad-FE (LOESTRIN 24 FE) 1-20 MG-MCG(24) tablet, Take 1 tablet by mouth daily. (Patient not taking: Reported on 01/11/2023), Disp: 28 tablet, Rfl: 11   polyethylene glycol (MIRALAX) 17 g packet, Take 17 g by mouth daily. 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    PAST SURGICAL HISTORY: Past Surgical History:  Procedure Laterality Date   CHOLECYSTECTOMY Bilateral 10/18/2022   Procedure: LAPAROSCOPIC CHOLECYSTECTOMY WITH INTRAOPERATIVE CHOLANGIOGRAM;  Surgeon: Gaynelle Adu, MD;  Location: [facility];  Service: General;  Laterality: Bilateral;    FAMILY HISTORY: Family History  Problem Relation Age of Onset   ADD / ADHD Sister     SOCIAL HISTORY: Social History   Socioeconomic History   Marital status: Single    Spouse name: Not on file   Number of children: Not on file   Years of education: Not on file   Highest education level: Not on file  Occupational History   Not on file  Tobacco Use   Smoking status: Some Days    Types: Cigars   Smokeless tobacco: Never  Vaping Use   Vaping Use: Never used  Substance and Sexual Activity   Alcohol use: Not Currently   Drug use: Never   Sexual activity: Yes    Partners: Male, Female    Birth control/protection: None, Condom  Other Topics Concern   Not on file  Social  History Narrative   Lives with mom, and sister Dana Woods. Mom smokes inside.Dad lives nearby with new wife.      Right Handed   2 Sodas per Day    Social Determinants of Health   Financial Resource Strain: Not on file  Food Insecurity: No Food Insecurity (01/11/2023)   Hunger Vital Sign    Worried About Running Out of Food in the Last Year: Never true    Ran Out of Food in the Last Year: Never true  Transportation Needs: No Transportation Needs (01/11/2023)   PRAPARE - Administrator, Civil Service (Medical): No    Lack of Transportation (Non-Medical): No  Physical Activity: Not on file  Stress: Not on file  Social Connections: Not on file  Intimate Partner Violence: Not At Risk (01/11/2023)   Humiliation, Afraid, Rape, and Kick questionnaire    Fear of Current or Ex-Partner: No  Emotionally Abused: No    Physically Abused: No    Sexually Abused: No       PHYSICAL EXAM  Vitals:   02/01/23 0842  BP: 111/76  Pulse: 95  Weight: (!) 302 lb (137 kg)  Height: 5\' 6"  (1.676 m)    Body mass index is 48.74 kg/m.   General: The patient is well-developed and well-nourished and in no acute distress  HEENT:  Head is [place]/AT.  Sclera are anicteric.  Funduscopic exam shows normal optic discs and retinal vessels.  Neck: No carotid bruits are noted.  The neck is nontender.  Cardiovascular: The heart has a regular rate and rhythm with a normal S1 and S2. There were no murmurs, gallops or rubs.    Skin: Extremities are without rash or  edema.  Musculoskeletal:  Back is nontender  Neurologic Exam  Mental status: The patient is alert and oriented x 3 at the time of the examination. The patient has apparent normal recent and remote memory, with an apparently normal attention span and concentration ability.   Speech is normal.  Cranial nerves: Extraocular movements are full. Pupils are equal, round, and reactive to light and accomodation.  Color vision was symmetric l.   Facial symmetry is present. There is good facial sensation to soft touch bilaterally.Facial strength is normal.  Trapezius and sternocleidomastoid strength is normal. No dysarthria is noted.  The tongue is midline, and the patient has symmetric elevation of the soft palate. No obvious hearing deficits are noted.  Motor:  Muscle bulk is normal.   Tone is normal. Strength is  5 / 5 in all 4 extremities.   Sensory: Sensory testing is intact to pinprick, soft touch and vibration sensation in all 4 extremities.  Good sensation in the trunk  Coordination: Cerebellar testing reveals good finger-nose-finger and heel-to-shin bilaterally.  Gait and station: Station is normal.   Gait is normal. Tandem gait is mildly wide. Romberg is negative.   Reflexes: Deep tendon reflexes are symmetric, 2 in the arms, 3 at the knees and 1 at the ankles..   Plantar responses are flexor.    DIAGNOSTIC DATA (LABS, IMAGING, TESTING) - I reviewed patient records, labs, notes, testing and imaging myself where available.  Lab Results  Component Value Date   WBC 9.3 01/12/2023   HGB 12.3 01/12/2023   HCT 37.6 01/12/2023   MCV 86.2 01/12/2023   PLT 262 01/12/2023      Component Value Date/Time   NA 138 01/12/2023 0505   K 4.2 01/12/2023 0505   CL 109 01/12/2023 0505   CO2 21 (L) 01/12/2023 0505   GLUCOSE 135 (H) 01/12/2023 0505   BUN 15 01/12/2023 0505   CREATININE 0.84 01/12/2023 0505   CALCIUM 9.4 01/12/2023 0505   PROT 6.9 01/12/2023 0505   ALBUMIN 3.3 (L) 01/12/2023 0505   AST 18 01/12/2023 0505   ALT 16 01/12/2023 0505   ALKPHOS 83 01/12/2023 0505   BILITOT 0.4 01/12/2023 0505   GFRNONAA >60 01/12/2023 0505   Lab Results  Component Value Date   CHOL 176 01/12/2023   HDL 51 01/12/2023   LDLCALC 119 (H) 01/12/2023   TRIG 31 01/12/2023   CHOLHDL 3.5 01/12/2023   Lab Results  Component Value Date   HGBA1C 5.0 01/12/2023      ASSESSMENT AND PLAN   Multiple sclerosis with acute neurologic  event (HCC) - Plan: Hepatitis B surface antigen, IgG, IgA, IgM, QuantiFERON-TB Gold Plus, Varicella zoster antibody, IgG, Hepatitis C antibody,  Hepatitis B core antibody, total, Hepatitis B surface antibody,qualitative, Stratify JCV Antibody Test ([facility])  High risk medication use - Plan: Hepatitis B surface antigen, IgG, IgA, IgM, QuantiFERON-TB Gold Plus, Varicella zoster antibody, IgG, Hepatitis C antibody, Hepatitis B core antibody, total, Hepatitis B surface antibody,qualitative, Stratify JCV Antibody Test ([facility])  Numbness  Ataxic gait  Other fatigue  Adult BMI 45.0-49.9 kg/sq m (HCC)   In summary, Ms. 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  Fortunately, after a course of steroids she is close to baseline.  She still has a mildly reduced tandem gait.  During her evaluation in the hospital, she was also found to be very vitamin D deficient.  Due to a spinal cord presentation, her MS has potential to have a higher than average level of aggressiveness.  Therefore, I will want to start her on a highly effective DMT.  We will initiate Kesimpta.  She feels she is able to do self injection.  We will check lab work to make sure that she is a candidate including chronic infections and IgG/IgM.  Symptomatically, she has improved.  We discussed that she should be able to do most duties at work though her balance may be slightly off which could affect balance tasks, climbing, etc.  We discussed the importance of healthy lifestyle with better diet and more activity.  I will start phentermine to help as appetite suppression as well as a medication that may help her fatigue.  EKG was fine in the hospital.  She will return to see me in 4 months or sooner if there are new or worsening neurologic symptoms.   Thank you for asking me to see Ms. Lesko.  Please let me know if  I can be of further assistance with her or other patients in the future.   Johathan Province A. Epimenio Foot, MD, [facility] 02/01/2023, 10:17 AM Certified in Neurology, Clinical Neurophysiology, Sleep Medicine and Neuroimaging  [facility] [address], [address] [place], [place] [address] [phone]

## 2023-01-30 ENCOUNTER — Ambulatory Visit (INDEPENDENT_AMBULATORY_CARE_PROVIDER_SITE_OTHER): Payer: Medicaid Other | Admitting: Primary Care

## 2023-01-30 ENCOUNTER — Encounter (INDEPENDENT_AMBULATORY_CARE_PROVIDER_SITE_OTHER): Payer: Self-pay | Admitting: Primary Care

## 2023-01-30 VITALS — BP 93/67 | HR 92 | Resp 16 | Ht 66.0 in | Wt 299.6 lb

## 2023-01-30 DIAGNOSIS — E559 Vitamin D deficiency, unspecified: Secondary | ICD-10-CM

## 2023-01-30 DIAGNOSIS — F322 Major depressive disorder, single episode, severe without psychotic features: Secondary | ICD-10-CM | POA: Diagnosis not present

## 2023-01-30 DIAGNOSIS — Z7689 Persons encountering health services in other specified circumstances: Secondary | ICD-10-CM

## 2023-01-30 DIAGNOSIS — G35 Multiple sclerosis: Secondary | ICD-10-CM

## 2023-01-30 NOTE — Patient Instructions (Signed)
Calorie Counting for Weight Loss Calories are units of energy. Your body needs a certain number of calories from food to keep going throughout the day. When you eat or drink more calories than your body needs, your body stores the extra calories mostly as fat. When you eat or drink fewer calories than your body needs, your body burns fat to get the energy it needs. Calorie counting means keeping track of how many calories you eat and drink each day. Calorie counting can be helpful if you need to lose weight. If you eat fewer calories than your body needs, you should lose weight. Ask your health care provider what a healthy weight is for you. For calorie counting to work, you will need to eat the right number of calories each day to lose a healthy amount of weight per week. A dietitian can help you figure out how many calories you need in a day and will suggest ways to reach your calorie goal. A healthy amount of weight to lose each week is usually 1-2 lb (0.5-0.9 kg). This usually means that your daily calorie intake should be reduced by 500-750 calories. Eating 1,200-1,500 calories a day can help most women lose weight. Eating 1,500-1,800 calories a day can help most men lose weight. What do I need to know about calorie counting? Work with your health care provider or dietitian to determine how many calories you should get each day. To meet your daily calorie goal, you will need to: Find out how many calories are in each food that you would like to eat. Try to do this before you eat. Decide how much of the food you plan to eat. Keep a food log. Do this by writing down what you ate and how many calories it had. To successfully lose weight, it is important to balance calorie counting with a healthy lifestyle that includes regular activity. Where do I find calorie information?  The number of calories in a food can be found on a Nutrition Facts label. If a food does not have a Nutrition Facts label, try  to look up the calories online or ask your dietitian for help. Remember that calories are listed per serving. If you choose to have more than one serving of a food, you will have to multiply the calories per serving by the number of servings you plan to eat. For example, the label on a package of bread might say that a serving size is 1 slice and that there are 90 calories in a serving. If you eat 1 slice, you will have eaten 90 calories. If you eat 2 slices, you will have eaten 180 calories. How do I keep a food log? After each time that you eat, record the following in your food log as soon as possible: What you ate. Be sure to include toppings, sauces, and other extras on the food. How much you ate. This can be measured in cups, ounces, or number of items. How many calories were in each food and drink. The total number of calories in the food you ate. Keep your food log near you, such as in a pocket-sized notebook or on an app or website on your mobile phone. Some programs will calculate calories for you and show you how many calories you have left to meet your daily goal. What are some portion-control tips? Know how many calories are in a serving. This will help you know how many servings you can have of a certain   food. Use a measuring cup to measure serving sizes. You could also try weighing out portions on a kitchen scale. With time, you will be able to estimate serving sizes for some foods. Take time to put servings of different foods on your favorite plates or in your favorite bowls and cups so you know what a serving looks like. Try not to eat straight from a food's packaging, such as from a bag or box. Eating straight from the package makes it hard to see how much you are eating and can lead to overeating. Put the amount you would like to eat in a cup or on a plate to make sure you are eating the right portion. Use smaller plates, glasses, and bowls for smaller portions and to prevent  overeating. Try not to multitask. For example, avoid watching TV or using your computer while eating. If it is time to eat, sit down at a table and enjoy your food. This will help you recognize when you are full. It will also help you be more mindful of what and how much you are eating. What are tips for following this plan? Reading food labels Check the calorie count compared with the serving size. The serving size may be smaller than what you are used to eating. Check the source of the calories. Try to choose foods that are high in protein, fiber, and vitamins, and low in saturated fat, trans fat, and sodium. Shopping Read nutrition labels while you shop. This will help you make healthy decisions about which foods to buy. Pay attention to nutrition labels for low-fat or fat-free foods. These foods sometimes have the same number of calories or more calories than the full-fat versions. They also often have added sugar, starch, or salt to make up for flavor that was removed with the fat. Make a grocery list of lower-calorie foods and stick to it. Cooking Try to [place] your favorite foods in a healthier way. For example, try baking instead of frying. Use low-fat dairy products. Meal planning Use more fruits and vegetables. One-half of your plate should be fruits and vegetables. Include lean proteins, such as chicken, turkey, and fish. Lifestyle Each week, aim to do one of the following: 150 minutes of moderate exercise, such as walking. 75 minutes of vigorous exercise, such as running. General information Know how many calories are in the foods you eat most often. This will help you calculate calorie counts faster. Find a way of tracking calories that works for you. Get creative. Try different apps or programs if writing down calories does not work for you. What foods should I eat?  Eat nutritious foods. It is better to have a nutritious, high-calorie food, such as an avocado, than a food with  few nutrients, such as a bag of potato chips. Use your calories on foods and drinks that will fill you up and will not leave you hungry soon after eating. Examples of foods that fill you up are nuts and nut butters, vegetables, lean proteins, and high-fiber foods such as whole grains. High-fiber foods are foods with more than 5 g of fiber per serving. Pay attention to calories in drinks. Low-calorie drinks include water and unsweetened drinks. The items listed above may not be a complete list of foods and beverages you can eat. Contact a dietitian for more information. What foods should I limit? Limit foods or drinks that are not good sources of vitamins, minerals, or protein or that are high in unhealthy fats. These   include: Candy. Other sweets. Sodas, specialty coffee drinks, alcohol, and juice. The items listed above may not be a complete list of foods and beverages you should avoid. Contact a dietitian for more information. How do I count calories when eating out? Pay attention to portions. Often, portions are much larger when eating out. Try these tips to keep portions smaller: Consider sharing a meal instead of getting your own. If you get your own meal, eat only half of it. Before you start eating, ask for a container and put half of your meal into it. When available, consider ordering smaller portions from the menu instead of full portions. Pay attention to your food and drink choices. Knowing the way food is cooked and what is included with the meal can help you eat fewer calories. If calories are listed on the menu, choose the lower-calorie options. Choose dishes that include vegetables, fruits, whole grains, low-fat dairy products, and lean proteins. Choose items that are boiled, broiled, grilled, or steamed. Avoid items that are buttered, battered, fried, or served with cream sauce. Items labeled as crispy are usually fried, unless stated otherwise. Choose water, low-fat milk,  unsweetened iced tea, or other drinks without added sugar. If you want an alcoholic beverage, choose a lower-calorie option, such as a glass of wine or light beer. Ask for dressings, sauces, and syrups on the side. These are usually high in calories, so you should limit the amount you eat. If you want a salad, choose a garden salad and ask for grilled meats. Avoid extra toppings such as bacon, cheese, or fried items. Ask for the dressing on the side, or ask for olive oil and vinegar or lemon to use as dressing. Estimate how many servings of a food you are given. Knowing serving sizes will help you be aware of how much food you are eating at restaurants. Where to find more information Centers for Disease Control and Prevention: www.cdc.gov U.S. Department of Agriculture: myplate.gov Summary Calorie counting means keeping track of how many calories you eat and drink each day. If you eat fewer calories than your body needs, you should lose weight. A healthy amount of weight to lose per week is usually 1-2 lb (0.5-0.9 kg). This usually means reducing your daily calorie intake by 500-750 calories. The number of calories in a food can be found on a Nutrition Facts label. If a food does not have a Nutrition Facts label, try to look up the calories online or ask your dietitian for help. Use smaller plates, glasses, and bowls for smaller portions and to prevent overeating. Use your calories on foods and drinks that will fill you up and not leave you hungry shortly after a meal. This information is not intended to replace advice given to you by your health care provider. Make sure you discuss any questions you have with your health care provider. Document Revised: 09/19/2019 Document Reviewed: 09/19/2019 Elsevier Patient Education  2023 [employer]  

## 2023-01-30 NOTE — Progress Notes (Signed)
[facility]   Subjective:   Dana Woods is a 22 y.o. female presents for hospital follow up and establish care. Admit date to the hospital was 01/11/23, patient was discharged from the hospital on 01/15/23, patient was admitted for:  Multiple sclerosis with acute neurologic event ,Morbid obesity , Hypercholesteremia, Current severe episode of major depressive disorder without psychotic features without prior episode  Vitamin D deficiency. Today she is in a melocney mood a few smile. 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 Appears dehydrated encourage fluids.  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 No other reaction.    Nickel Other (See Comments)      Current Outpatient Medications on File Prior to Visit  Medication Sig Dispense Refill   Norethindrone Acetate-Ethinyl Estrad-FE (LOESTRIN 24 FE) 1-20 MG-MCG(24) tablet Take 1 tablet by mouth daily. (Patient not taking: Reported on 01/11/2023) 28 tablet 11   polyethylene glycol (MIRALAX) 17 g packet Take 17 g by mouth daily. (Patient not taking: Reported on 01/11/2023) 14 each 0   Vitamin D, Ergocalciferol, (DRISDOL) 1.25 MG (50000 UNIT) CAPS capsule Take 1 capsule (50,000 Units total) by mouth every 7 (seven) days. 6 capsule 0   [DISCONTINUED] FLUoxetine (PROZAC) 20 MG capsule Take 1 capsule (20 mg total) by mouth daily. 30 capsule 0   [DISCONTINUED] ipratropium (ATROVENT) 0.06 % nasal spray Place 2 sprays into both nostrils 3 (three) times daily. 15 mL 12   No current facility-administered medications on file prior to visit.   Review of System: Comprehensive ROS  Pertinent positive and negative noted in HPI    Objective:  Blood Pressure 93/67 (BP Location: Right Arm, Patient Position: Sitting, Cuff Size: Large) Comment (Cuff Size): thigh  Pulse 92   Respiration 16   Height 5\' 6"  (1.676 m)   Weight 299 lb 9.6 oz (135.9 kg)   Oxygen Saturation 100%   Body Mass Index 48.36 kg/m   Physical Exam: General Appearance: Well nourished, in no apparent distress. Eyes: PERRLA, EOMs, conjunctiva no swelling or erythema Sinuses: No Frontal/maxillary tenderness ENT/Mouth: Ext aud canals clear, TMs without erythema, bulging. No erythema, swelling, or exudate on post pharynx.  Tonsils not swollen or erythematous. Hearing normal.  Neck: Supple, thyroid normal.  Respiratory: Respiratory effort normal, BS equal bilaterally without rales, rhonchi, wheezing or stridor.  Cardio: RRR with no MRGs. Brisk peripheral pulses without edema.  Abdomen: Soft, + BS.  Non tender, no guarding, rebound, hernias, masses. Lymphatics: Non tender without lymphadenopathy.  Musculoskeletal: Full ROM, 5/5 strength, normal gait.  Skin: Warm, dry without rashes, lesions, ecchymosis.  Neuro: Cranial nerves intact. Normal muscle tone, no cerebellar symptoms. Sensation intact.  Psych: Awake and oriented X 3, normal affect, Insight and Judgment appropriate.    Assessment:  Marquisa was seen today for hospitalization follow-up, shoulder pain, headache, neck pain and back pain.  Diagnoses and all orders for this visit:  Vitamin D deficiency Your Vitamin D is low. Vitamin D is needed to make and keep bones strong. Complete the prescription strength vitamin D  tablet once weekly  You can purchase  vitamin D 2000 iu over the counter  and take 1 tablet daily.  Will repeat your levels in 3 months.   Vitamin D is needed to make and keep bones strong.   Multiple sclerosis with acute neurologic event (HCC) Follow up with Sater, Pearletha Furl, MD (Neurology); follow up Follow up with [facility];  Your medical equiptment vendor  Morbid obesity (HCC) Discussed diet and exercise for person with BMI >25. Instructed: You must burn more calories than you eat. Losing 5 percent of your body weight should be considered a success. In the longer term, losing more than 15 percent of your body weight and staying at this weight is an extremely good result. However, keep in mind that even losing 5 percent of your body weight leads to important health benefits, so try not to get discouraged if you're not able to lose more than this. Will recheck weight in 3-6 months.   Encounter to establish care   Current severe episode of major depressive disorder without psychotic features without prior episode [facility])  See HPI/ refer to CSW  This note has been created with Education officer, environmental. Any transcriptional errors are unintentional.   Grayce Sessions, NP 01/30/2023, 1:24 PM

## 2023-02-01 ENCOUNTER — Ambulatory Visit (INDEPENDENT_AMBULATORY_CARE_PROVIDER_SITE_OTHER): Payer: Medicaid Other | Admitting: Neurology

## 2023-02-01 ENCOUNTER — Encounter: Payer: Self-pay | Admitting: Neurology

## 2023-02-01 ENCOUNTER — Telehealth: Payer: Self-pay | Admitting: *Deleted

## 2023-02-01 VITALS — BP 111/76 | HR 95 | Ht 66.0 in | Wt 302.0 lb

## 2023-02-01 DIAGNOSIS — R5383 Other fatigue: Secondary | ICD-10-CM

## 2023-02-01 DIAGNOSIS — R200 Anesthesia of skin: Secondary | ICD-10-CM

## 2023-02-01 DIAGNOSIS — G35 Multiple sclerosis: Secondary | ICD-10-CM | POA: Diagnosis not present

## 2023-02-01 DIAGNOSIS — R260 Ataxic gait: Secondary | ICD-10-CM

## 2023-02-01 DIAGNOSIS — Z6841 Body Mass Index (BMI) 40.0 and over, adult: Secondary | ICD-10-CM

## 2023-02-01 DIAGNOSIS — Z79899 Other long term (current) drug therapy: Secondary | ICD-10-CM

## 2023-02-01 DIAGNOSIS — G35D Multiple sclerosis, unspecified: Secondary | ICD-10-CM

## 2023-02-01 MED ORDER — PHENTERMINE HCL 37.5 MG PO CAPS: 37.5000 mg | ORAL_CAPSULE | ORAL | 5 refills | Status: DC

## 2023-02-01 NOTE — Telephone Encounter (Signed)
Placed [facility] lab in [facility] lock box for routine lab pick up. Results pending. 

## 2023-02-03 LAB — HEPATITIS B SURFACE ANTIGEN: Hepatitis B Surface Ag: NEGATIVE

## 2023-02-03 LAB — IGG, IGA, IGM: IgG (Immunoglobin G), Serum: 923 mg/dL (ref 586–1602)

## 2023-02-03 LAB — QUANTIFERON-TB GOLD PLUS

## 2023-02-05 LAB — QUANTIFERON-TB GOLD PLUS
QuantiFERON Mitogen Value: >10 IU/mL
QuantiFERON Nil Value: 0.01 IU/mL
QuantiFERON TB2 Ag Value: 0.01 IU/mL
QuantiFERON-TB Gold Plus: NEGATIVE

## 2023-02-05 LAB — IGG, IGA, IGM
IgA/Immunoglobulin A, Serum: 229 mg/dL (ref 87–352)
IgM (Immunoglobulin M), Srm: 57 mg/dL (ref 26–217)

## 2023-02-05 LAB — VARICELLA ZOSTER ANTIBODY, IGG: Varicella zoster IgG: 332 index (ref >165)

## 2023-02-05 LAB — HEPATITIS B CORE ANTIBODY, TOTAL: Hep B Core Total Ab: NEGATIVE

## 2023-02-05 LAB — HEPATITIS C ANTIBODY: Hep C Virus Ab: NONREACTIVE

## 2023-02-05 LAB — HEPATITIS B SURFACE ANTIBODY,QUALITATIVE: Hep B Surface Ab, Qual: NONREACTIVE

## 2023-02-08 ENCOUNTER — Telehealth: Payer: Self-pay | Admitting: *Deleted

## 2023-02-08 NOTE — Telephone Encounter (Signed)
Called pt at [phone] to let her know Dr. Epimenio Foot said labs look ok and cleared to start on Kesimpta.   However, insurance scanned in was an application, not insurance cards. She states she is waiting on insurance cards to be mailed. [place] Spaniel it could take 7 business days. Asked her to come by office once she receives so they can be scanned in. Aware we will need this in order to send in Kesimpta start form.

## 2023-02-22 ENCOUNTER — Telehealth (INDEPENDENT_AMBULATORY_CARE_PROVIDER_SITE_OTHER): Payer: Self-pay | Admitting: Licensed Clinical Social Worker

## 2023-02-22 ENCOUNTER — Encounter (INDEPENDENT_AMBULATORY_CARE_PROVIDER_SITE_OTHER): Payer: Self-pay

## 2023-02-22 NOTE — Telephone Encounter (Signed)
Called pt to follow up about her mental health depression. She mentioned that she stays to herself and stays in her room. She isn't feeling much better. Pt agreed to being referred to an agency where she can see someone often. PCP referred pt for current severe episode of major depressive disorder without psychotic features without prior episode (HCC). I will send her a list of agencies via MyChart.  Counseling Resources   [email or web address]  [facility] [address], [place], [place] [address] [phone] or [phone] Walk-in urgent care 24/7 for anyone  For [facility] ONLY New patient assessments and therapy walk-ins: Monday and Wednesday 8am-11am First and second Friday 1pm-5pm New patient psychiatry and medication management walk-ins: Mondays, Wednesdays, Thursdays, Fridays 8am-11am No psychiatry walk-ins on Tuesday   *Accepts all insurance and uninsured for Urgent Care needs *Accepts Medicaid and uninsured for outpatient treatment   [facility] (Therapy and psychiatry) [facility] (near [employer]) [address] [place], [place] [address] [phone] Fax: [phone] (INSURANCE REQUIRED-MEDICAID ACCEPTED)   [facility] Address: Four Locations  -[address] [place], [place], [place]                                 Phone: [phone] -[address], [place], [place]         Phone: [phone] -[address] [place], [place]                      Phone: [phone] -[address], [place], [place]         Phone: [phone] Age Range: Children, Adolescents, and Adults Specialty Areas: Depression, Anxiety, ADHD, Substance Abuse, Bipolar Disorder, etc.  [facility] Address: [address], [place], [place] Phone: [phone] Age Range: Children, Adults, and Elders Specialty  Areas: Couple, Family, Group, Individual     [facility] Address: [address] [place], [place] Phone: [phone] Age Range: Children, Adolescents, and Adults Specialty Areas: Individual, Family and Couples Therapy, and Substance Abuse  [facility] Address: [address] [place], [place] [address] Phone: [phone] Age Range: Adolescents/Teenagers and Adults Specialty Areas: Individual, Family, Couples, and Group Counseling   [facility] Address: [address] [place], [place] Phone: [phone] Age Range: Children and Adults Specialty Areas: Individual, Group, and Family counseling to people who have experienced domestic violence or sexual assault  [facility] Address: [address] [place], [place] [address] Phone: [phone] Age Range: Adults & Children (Ages 3+) Specialty Areas: Mental Health and Substance Abuse Counseling Services  [facility] Address: [address] [place], [place], [place] [address] Phone: [phone]             Age Range: All Ages  Specialty Areas: Common mental health diagnoses such as Anxiety, Depression, ADD/ADHD, Bipolar, and PTSD, Substance Abuse Evaluations and Counseling   [facility] at [facility] [address] [place] [address] [place],  [place]  [address] [phone] Call for appointment  [facility] [place] (Therapy only)  [facility] [address], [place], [place] [address] Monday - Friday: 8:30 a.m.-12 p.m. / 1 p.m.-2:30 p.m.  [facility] [address], [place], [place] [address] Monday-Friday: 8:30 a.m.-12 p.m. / 2-3:30 p.m. (INSURANCE REQUIRED -MEDICAID ACCEPTED)  They do offer a sliding fee scale $20-$30/session   [facility] [address] [place], [place] [address] Phone: [phone]  [facility] [address] [place] [address]  Phone: [phone] (Does not accept Medicaid) (only one provider accepts Medicare)  [facility] [address] (at [employer], [place] [phone] (Accepts Medicaid and Medicare)  [facility]) [address]  [place], [place] [address]  Phone: [phone]  [address] [place], [place] [address] Phone: [phone] [facility] Medicaid) [facility] (Therapy only)  [address] [place], [place] [address] Phone: [phone]   [facility] (Therapy only)  [address] [place], [place] [address] Phone: [phone] [facility] & Medicare)   [facility] [address] [place], [place] [address] Phone: [phone] (Accepts [place] Health Choice) [facility] [phone] [address] [place], [place] [address] Phone: [phone] [facility]) [facility] (Psychiatry only)  [phone] [address], [place], [place] [address] (Accepts Medicaid and Medicare) [facility] (Psychiatry and therapy)  [address] [place], [place] [address] [phone] [facility] Medicare) [facility] (psychiatry and therapy) [address], [place], [place] [address] [phone]  [facility] at [address], [place], [place] [address] [phone] Accept [address], [place], [place], [place], [employer],  and the following types of Medicaid; Alliance, [place], Partners, [place], [place] Health Choice, [employer], [employer] [place], [place] [employer], and [place], as well as offering a Careers adviser and private payment options. Provides In-Office Appointments, Virtual Appointments, and Phone Consultations Offers medication management for ages 29 years old and up, including,  Medication Management for Suboxone and [facility] [phone] [address], [place], [place] [address] (Accepts Medicaid and Medicare)         19.  [facility] (therapy only)  [address] [place], [place] [address]            [phone] (Accepts medicare) 20. [facility]  (Suboxone and methodone) [phone] [address] [place], [place] [address] To Be Eligible for Opioid Treatment at [facility] you must be at least 24 years of age you have already tried other interventions that were not successful such as opioid detox, inpatient rehab for opioids, or outpatient counseling specifically for opioid dependency your [facility] drug test must be completely free of benzodiazepines (klonopin, xanax, valium, ativan, or other benz) you have reliable transportation to the [facility] clinic in [place] you recognize that counseling is a critical component of [facility]' Opioid Program and you agree to attend all required counseling sessions you are committed to total drug abstinence and will conscientiously strive to remain free of alcohol, marijuana, and other illicit substances while in treatment you desire a peaceful treatment atmosphere in which personal responsibility and respect toward staff and clients is the norm   21. [facility] [address] [place], [place], [place] [address] Offers SAIOP (Substance Abuse Intensive Outpatient Program) [phone] 22. [facility] [address] [place], [place] [address] [phone] (Accepts medicare)  For those who are tech savvy, go on psychology today, type in your local city (i.e. [place]. [place]) and specify your insurance at the top of the screen after you search. (Medicaid if needed). You can also specify whether you are interested in therapy and psychiatry.  www.psychologytoday.com/us

## 2023-03-01 ENCOUNTER — Telehealth: Payer: Self-pay | Admitting: *Deleted

## 2023-03-01 NOTE — Telephone Encounter (Signed)
-----   Message from Asa Lente, MD sent at 02/27/2023  8:04 PM EDT ----- Could you please check on the status of this.  Thank you

## 2023-03-01 NOTE — Telephone Encounter (Signed)
I called pt and asked about her insurance cards if she could upload to mychart so we could start the process for her MS medication.  She said had new cards and would upload.

## 2023-03-07 NOTE — Telephone Encounter (Signed)
We are still waiting on her insurance cards. Can you call her and remind her of this? She can upload to [facility] or bring them by the office for scanning.

## 2023-03-09 NOTE — Telephone Encounter (Signed)
Patient uploaded pictures of her insurance cards on mychart. Can you send the Kesimpta start form with the insurance cards to [facility]?

## 2023-03-09 NOTE — Telephone Encounter (Signed)
   Form along with insurance card faxed to [facility], confirmation received.

## 2023-03-20 NOTE — Telephone Encounter (Signed)
Initiated PA for [employer] on CMM. Sent to [employer]. Key: [id number]. Should have a determination within 3-5 business days.

## 2023-03-21 NOTE — Telephone Encounter (Signed)
I contacted [employer]. They do not have the start form for Kesimpta.

## 2023-03-21 NOTE — Telephone Encounter (Signed)
Refaxed to K[facility]impta.

## 2023-03-22 NOTE — Telephone Encounter (Signed)
I called Dana Woods and LMVM for her to call [facility] to move things a little faster. Their phone number is [phone].

## 2023-03-23 NOTE — Telephone Encounter (Signed)
Reeived from [facility] that they recently recived a referral for pt.  ([place] [facility]  [phone], [phone] [phone].

## 2023-04-05 NOTE — Telephone Encounter (Signed)
I called patient.  She reports that her Althia Forts is scheduled to be delivered today.  She plans on starting the Kesimpta sometime within the next few days.  I will check on her next week to confirm her first injection well.  In the interim, patient will call us with questions or concerns.

## 2023-04-18 NOTE — Telephone Encounter (Signed)
I called patient to confirm that she is doing well on Kesimpta.  No answer, left a message asking her to call us back.  If patient returns my call, please ask her how she is doing her how her Althia Forts is going and if she has any questions or concerns.

## 2023-04-26 NOTE — Telephone Encounter (Signed)
I called patient.  She reports that she has started Kesimpta and is tolerating it well.  She will let us know if she needs [place] prior to her appointment in October.  Patient had no questions or concerns at this time.

## 2023-06-07 ENCOUNTER — Ambulatory Visit (INDEPENDENT_AMBULATORY_CARE_PROVIDER_SITE_OTHER): Payer: Medicaid Other | Admitting: Neurology

## 2023-06-07 ENCOUNTER — Encounter: Payer: Self-pay | Admitting: Neurology

## 2023-06-07 VITALS — BP 111/68 | HR 96 | Ht 66.0 in | Wt 314.0 lb

## 2023-06-07 DIAGNOSIS — G35 Multiple sclerosis: Secondary | ICD-10-CM

## 2023-06-07 DIAGNOSIS — R200 Anesthesia of skin: Secondary | ICD-10-CM

## 2023-06-07 DIAGNOSIS — R260 Ataxic gait: Secondary | ICD-10-CM | POA: Diagnosis not present

## 2023-06-07 DIAGNOSIS — Z79899 Other long term (current) drug therapy: Secondary | ICD-10-CM | POA: Diagnosis not present

## 2023-06-07 DIAGNOSIS — R5383 Other fatigue: Secondary | ICD-10-CM | POA: Diagnosis not present

## 2023-06-07 MED ORDER — PREDNISONE 50 MG PO TABS: ORAL_TABLET | ORAL | 0 refills | Status: AC

## 2023-06-07 NOTE — Patient Instructions (Signed)
Take the prednisone pill the nght before your Kesimpta injection  30-60 minutes before the injection, take 25 mg Benadryl and 600 mg Ibuprofen.

## 2023-06-07 NOTE — Progress Notes (Signed)
[facility]  PATIENT: Dana Woods DOB: April 23, 2001  REFERRING DOCTOR OR PCP: Gwinda Passe, NP SOURCE: Patient, mother, notes from emergency rooms and admission, imaging and lab reports, MRI images personally reviewed.  MRI images were also shown to the patient and her mother  _________________________________   HISTORICAL  CHIEF COMPLAINT:  Chief Complaint  Patient presents with   Follow-up    Pt in room 11, mother room. Here for MS follow up on Kesimpta. Pt took Kesimpta one time had shakes, pt was extremely cold, no fever. Pt never took another dose. Pt wants to discuss another MS medication.  Mother reports she had flare up 2 weeks. Pt reports legs tingling at times when standing for long periods of time.     HISTORY OF PRESENT ILLNESS:  Dana Woods is a 22 y.o. woman with multiple sclerosis.  Update 06/07/2023 She took one dose of Kesimpta early September, 2024.  She had chills and shaking so decided not to take additional doses.  Those symptoms lasted about 90 minutes.   When she woke up, she felt better.     Currently, she feels gait is better but not to baseline.   She holds the rail on stairs going down.    Strength is now normal.  Numbness is resolved.    She has nocturia once a night    She notes fatigue.  She is sleepy some during the day.    She falls asleep easily but wakes up some nights. She does not snore or have OSA signs.  Denies depression or anxiety.     She denies change in cognition. However, sometimes auditory processing seems slower and she asks people to repeat.   Vitamin D deficiency was noted.  MS History: She had the onset of left leg numbness around Jan 09, 2023.   The numbness increased and went up to her lower abdomen over a couple days and weakness.  She was working overtime and iniitally felt she was just overdoing it.  Due to the increase in symptoms, she presented to the emergency room 01/11/2023  The numbness started in the  foot and went up the leg to the pelvis.  The weakness was mild in the left leg.   She was treated with 5 days of IV Solu-Medrol with improvement of symptoms by discharge.   She improved further after discharge.      While in the hospital, she had a urine culture showing Proteus mirabilis treated with amoxicillin.  She denies any other episode of neurologic symptoms. MRI c/w MS.   A T-spine lesion (T7-T8) enhanced c/w acute demyelination and likely source of her symptoms.    I had started [facility] June 2024.  She took one dose and had chills so decided not to take any more     Imaging: MRI of the head 01/11/2023 showed multiple T2/FLAIR hyperintense foci in the periventricular, juxtacortical and deep white matter of the cerebral hemispheres.  Additional focus is noted in the left mid pons.  None of the foci enhance.  MRI of the cervical, thoracic and lumbar spine 01/11/2023 showed a T2 hyperintense focus adjacent to T7-T8 towards the left that enhanced with contrast.  The study was otherwise normal.  Other medical issues: Depression, asthma, hyperlipidemia  Laboratory: Vitamin D was markedly low at 6.  Anti-NMO antibody and anti-MOG antibodies were negative.  HIV was negative.  REVIEW OF SYSTEMS: Constitutional: No fevers, chills, sweats, or change in appetite Eyes: No visual changes,  double vision, eye pain Ear, nose and throat: No hearing loss, ear pain, nasal congestion, sore throat Cardiovascular: No chest pain, palpitations Respiratory:  No shortness of breath at rest or with exertion.   No wheezes GastrointestinaI: No nausea, vomiting, diarrhea, abdominal pain, fecal incontinence Genitourinary:  No dysuria, urinary retention or frequency.  No nocturia. Musculoskeletal:  No neck pain, back pain Integumentary: No rash, pruritus, skin lesions Neurological: as above Psychiatric: No depression at this time.  No anxiety Endocrine: No palpitations, diaphoresis, change in appetite, change in  weigh or increased thirst Hematologic/Lymphatic:  No anemia, purpura, petechiae. Allergic/Immunologic: No itchy/runny eyes, nasal congestion, recent allergic reactions, rashes  ALLERGIES: Allergies  Allergen Reactions   Gadavist [Gadobutrol] Nausea Only    Nausea right after contrast was given. No other reaction.    Nickel Other (See Comments)    HOME MEDICATIONS:  Current Outpatient Medications:    predniSONE (DELTASONE) 50 MG tablet, Take one po the night before your Kesimpta injection, Disp: 12 tablet, Rfl: 0   Norethindrone Acetate-Ethinyl Estrad-FE (LOESTRIN 24 FE) 1-20 MG-MCG(24) tablet, Take 1 tablet by mouth daily. (Patient not taking: Reported on 01/11/2023), Disp: 28 tablet, Rfl: 11   Ofatumumab (KESIMPTA) 20 MG/0.4ML SOAJ, Inject 20 mg into the skin every 28 (twenty-eight) days. (Patient not taking: Reported on 06/07/2023), Disp: , Rfl:    phentermine 37.5 MG capsule, Take 1 capsule (37.5 mg total) by mouth every morning. (Patient not taking: Reported on 06/07/2023), Disp: 30 capsule, Rfl: 5   polyethylene glycol (MIRALAX) 17 g packet, Take 17 g by mouth daily. (Patient not taking: Reported on 01/11/2023), Disp: 14 each, Rfl: 0   Vitamin D, Ergocalciferol, (DRISDOL) 1.25 MG (50000 UNIT) CAPS capsule, Take 1 capsule (50,000 Units total) by mouth every 7 (seven) days. 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    PAST SURGICAL HISTORY: Past Surgical History:  Procedure Laterality Date   CHOLECYSTECTOMY Bilateral 10/18/2022   Procedure: LAPAROSCOPIC CHOLECYSTECTOMY WITH INTRAOPERATIVE CHOLANGIOGRAM;  Surgeon: Gaynelle Adu, MD;  Location: [facility];  Service: General;   Laterality: Bilateral;    FAMILY HISTORY: Family History  Problem Relation Age of Onset   ADD / ADHD Sister     SOCIAL HISTORY: Social History   Socioeconomic History   Marital status: Single    Spouse name: Not on file   Number of children: Not on file   Years of education: Not on file   Highest education level: Not on file  Occupational History   Not on file  Tobacco Use   Smoking status: Some Days    Types: Cigars   Smokeless tobacco: Never  Vaping Use   Vaping status: Never Used  Substance and Sexual Activity   Alcohol use: Not Currently   Drug use: Never   Sexual activity: Yes    Partners: Male, Female    Birth control/protection: None, Condom  Other Topics Concern   Not on file  Social History Narrative   Lives with mom, and sister Demetria. Mom smokes inside.Dad lives nearby with new wife.      Right Handed   2 Sodas  per Day    Social Determinants of Health   Financial Resource Strain: Not on file  Food Insecurity: No Food Insecurity (01/11/2023)   Hunger Vital Sign    Worried About Running Out of Food in the Last Year: Never true    Ran Out of Food in the Last Year: Never true  Transportation Needs: No Transportation Needs (01/11/2023)   PRAPARE - Administrator, Civil Service (Medical): No    Lack of Transportation (Non-Medical): No  Physical Activity: Not on file  Stress: Not on file  Social Connections: Not on file  Intimate Partner Violence: Not At Risk (01/11/2023)   Humiliation, Afraid, Rape, and Kick questionnaire    Fear of Current or Ex-Partner: No    Emotionally Abused: No    Physically Abused: No    Sexually Abused: No       PHYSICAL EXAM  Vitals:   06/07/23 1002  BP: 111/68  Pulse: 96  Weight: (!) 314 lb (142.4 kg)  Height: 5\' 6"  (1.676 m)     Body mass index is 50.68 kg/m.   General: The patient is well-developed and well-nourished and in no acute distress  HEENT:  Head is [place]/AT.  Sclera are anicteric.      Skin: Extremities are without rash or  edema.  Musculoskeletal:  Back is nontender  Neurologic Exam  Mental status: The patient is alert and oriented x 3 at the time of the examination. The patient has apparent normal recent and remote memory, with an apparently normal attention span and concentration ability.   Speech is normal.  Cranial nerves: Extraocular movements are full.  Facial strength and sensation was normal.  Trapezius and sternocleidomastoid strength is normal. No dysarthria is noted.  . No obvious hearing deficits are noted.  Motor:  Muscle bulk is normal.   Tone is normal. Strength is  5 / 5 in all 4 extremities.   Sensory: Sensory testing is intact to pinprick, soft touch and vibration sensation in all 4 extremities.    Coordination: Cerebellar testing reveals good finger-nose-finger and heel-to-shin bilaterally.  Gait and station: Station is normal.   Gait is normal. Tandem gait is mildly wide. Romberg is negative.   Reflexes: Deep tendon reflexes are symmetric, 2 in the arms, 3 at the knees and 1 at the ankles.Marland Kitchen      DIAGNOSTIC DATA (LABS, IMAGING, TESTING) - I reviewed patient records, labs, notes, testing and imaging myself where available.  Lab Results  Component Value Date   WBC 9.3 01/12/2023   HGB 12.3 01/12/2023   HCT 37.6 01/12/2023   MCV 86.2 01/12/2023   PLT 262 01/12/2023      Component Value Date/Time   NA 138 01/12/2023 0505   K 4.2 01/12/2023 0505   CL 109 01/12/2023 0505   CO2 21 (L) 01/12/2023 0505   GLUCOSE 135 (H) 01/12/2023 0505   BUN 15 01/12/2023 0505   CREATININE 0.84 01/12/2023 0505   CALCIUM 9.4 01/12/2023 0505   PROT 6.9 01/12/2023 0505   ALBUMIN 3.3 (L) 01/12/2023 0505   AST 18 01/12/2023 0505   ALT 16 01/12/2023 0505   ALKPHOS 83 01/12/2023 0505   BILITOT 0.4 01/12/2023 0505   GFRNONAA >60 01/12/2023 0505   Lab Results  Component Value Date   CHOL 176 01/12/2023   HDL 51 01/12/2023   LDLCALC 119 (H) 01/12/2023    TRIG 31 01/12/2023   CHOLHDL 3.5 01/12/2023   Lab Results  Component Value Date  HGBA1C 5.0 01/12/2023      ASSESSMENT AND PLAN   Multiple sclerosis with acute neurologic event (HCC)  High risk medication use  Numbness  Ataxic gait  Other fatigue  She took 1 dose of Kesimpta and has some side effects.  We discussed that the first dose (sometimes first few) are usually the ones that are most prone to have tolerability issues.  I will have her take some premedication (prednisone the night before and Benadryl and Motrin right before the injection) Stay active and exercise as tolerated.  We discussed weight loss and exercise. Continue vitamin D supplementation. 4.  She will return to see me in 6 months or sooner if there are new or worsening neurologic symptoms.     Siria Calandro A. Epimenio Foot, MD, [facility] 06/07/2023, 12:52 PM Certified in Neurology, Clinical Neurophysiology, Sleep Medicine and Neuroimaging  [facility] [address] [place], [place] [address] [phone]

## 2023-06-22 DIAGNOSIS — Z0289 Encounter for other administrative examinations: Secondary | ICD-10-CM

## 2023-08-11 ENCOUNTER — Other Ambulatory Visit: Payer: Self-pay

## 2023-08-11 ENCOUNTER — Emergency Department (HOSPITAL_COMMUNITY)
Admission: EM | Admit: 2023-08-11 | Discharge: 2023-08-11 | Discharge status: Against Medical Advice | Payer: Medicaid Other | Attending: Physician Assistant | Admitting: Physician Assistant

## 2023-08-11 DIAGNOSIS — H538 Other visual disturbances: Secondary | ICD-10-CM | POA: Diagnosis not present

## 2023-08-11 DIAGNOSIS — Z5321 Procedure and treatment not carried out due to patient leaving prior to being seen by health care provider: Secondary | ICD-10-CM | POA: Insufficient documentation

## 2023-08-11 DIAGNOSIS — H5789 Other specified disorders of eye and adnexa: Secondary | ICD-10-CM | POA: Diagnosis not present

## 2023-08-11 DIAGNOSIS — H5710 Ocular pain, unspecified eye: Secondary | ICD-10-CM | POA: Diagnosis not present

## 2023-08-11 DIAGNOSIS — R519 Headache, unspecified: Secondary | ICD-10-CM | POA: Insufficient documentation

## 2023-08-11 LAB — CBC WITH DIFFERENTIAL/PLATELET
Abs Immature Granulocytes: 0.01 10*3/uL (ref 0.00–0.07)
Basophils Absolute: 0.1 10*3/uL (ref 0.0–0.1)
Basophils Relative: 1 %
Eosinophils Absolute: 0.1 10*3/uL (ref 0.0–0.5)
Eosinophils Relative: 2 %
HCT: 39.9 % (ref 36.0–46.0)
Hemoglobin: 12.5 g/dL (ref 12.0–15.0)
Immature Granulocytes: 0 %
Lymphocytes Relative: 30 %
Lymphs Abs: 1.9 10*3/uL (ref 0.7–4.0)
MCH: 27.4 pg (ref 26.0–34.0)
MCHC: 31.3 g/dL (ref 30.0–36.0)
MCV: 87.5 fL (ref 80.0–100.0)
Monocytes Absolute: 0.4 10*3/uL (ref 0.1–1.0)
Monocytes Relative: 7 %
Neutro Abs: 3.9 10*3/uL (ref 1.7–7.7)
Neutrophils Relative %: 60 %
Platelets: 347 10*3/uL (ref 150–400)
RBC: 4.56 MIL/uL (ref 3.87–5.11)
RDW: 14.2 % (ref 11.5–15.5)
WBC: 6.5 10*3/uL (ref 4.0–10.5)
nRBC: 0 % (ref 0.0–0.2)

## 2023-08-11 LAB — BASIC METABOLIC PANEL
Anion gap: 8 (ref 5–15)
BUN: 14 mg/dL (ref 6–20)
CO2: 21 mmol/L — ABNORMAL LOW (ref 22–32)
Calcium: 9 mg/dL (ref 8.9–10.3)
Chloride: 108 mmol/L (ref 98–111)
Creatinine, Ser: 0.76 mg/dL (ref 0.44–1.00)
GFR, Estimated: >60 mL/min (ref >60)
Glucose, Bld: 91 mg/dL (ref 70–99)
Potassium: 4.2 mmol/L (ref 3.5–5.1)
Sodium: 137 mmol/L (ref 135–145)

## 2023-08-11 LAB — HCG, SERUM, QUALITATIVE: Preg, Serum: NEGATIVE

## 2023-08-11 NOTE — ED Triage Notes (Addendum)
Pt referred to the by her ophthalmologist. Pt was told that she had increased pressure behind her eyes, and needed to go to the ER. Denies any N/V, pt does endorse a headache.

## 2023-08-11 NOTE — ED Provider Triage Note (Signed)
Emergency Medicine Provider Triage Evaluation Note  Dana Woods , a 22 y.o. female  was evaluated in triage.  Pt complains of eye pain, HA. Hx of MS. Seen at optho noted edema to eyes. Having HA. No weakness, arm legs. No hx of intracranial hypertension. No weakness to arms, legs. Follows with Dana Woods Dana Woods  Review of Systems  Positive: HA, vision changes, eye pain Negative:   Physical Exam  BP 118/88 (BP Location: Right Arm)   Pulse 93   Temp 98.3 F (36.8 C) (Oral)   Resp 18   Ht 5\' 6"  (1.676 m)   Wt (!) 142.4 kg   SpO2 98%   BMI 50.68 kg/m  Gen:   Awake, no distress   Resp:  Normal effort  MSK:   Moves extremities without difficulty  Other:    Medical Decision Making  Medically screening exam initiated at 3:26 PM.  Appropriate orders placed.  Dana Woods was informed that the remainder of the evaluation will be completed by another provider, this initial triage assessment does not replace that evaluation, and the importance of remaining in the ED until their evaluation is complete.  HA, vision changes   Dana Woods A, PA-C 08/11/23 1530

## 2023-08-14 ENCOUNTER — Observation Stay (HOSPITAL_COMMUNITY)
Admission: EM | Admit: 2023-08-14 | Discharge: 2023-08-15 | Disposition: A | Discharge status: Home / Self Care | Payer: Medicaid Other | Attending: Internal Medicine | Admitting: Internal Medicine

## 2023-08-14 ENCOUNTER — Other Ambulatory Visit: Payer: Self-pay

## 2023-08-14 ENCOUNTER — Emergency Department (HOSPITAL_COMMUNITY): Payer: Medicaid Other

## 2023-08-14 ENCOUNTER — Encounter (HOSPITAL_COMMUNITY): Payer: Self-pay

## 2023-08-14 DIAGNOSIS — R9082 White matter disease, unspecified: Secondary | ICD-10-CM | POA: Diagnosis not present

## 2023-08-14 DIAGNOSIS — J45909 Unspecified asthma, uncomplicated: Secondary | ICD-10-CM | POA: Diagnosis not present

## 2023-08-14 DIAGNOSIS — Z79899 Other long term (current) drug therapy: Secondary | ICD-10-CM | POA: Diagnosis not present

## 2023-08-14 DIAGNOSIS — Z6841 Body Mass Index (BMI) 40.0 and over, adult: Secondary | ICD-10-CM | POA: Diagnosis not present

## 2023-08-14 DIAGNOSIS — H4710 Unspecified papilledema: Principal | ICD-10-CM | POA: Diagnosis present

## 2023-08-14 DIAGNOSIS — G35 Multiple sclerosis: Secondary | ICD-10-CM

## 2023-08-14 DIAGNOSIS — J320 Chronic maxillary sinusitis: Secondary | ICD-10-CM | POA: Diagnosis not present

## 2023-08-14 DIAGNOSIS — F17290 Nicotine dependence, other tobacco product, uncomplicated: Secondary | ICD-10-CM | POA: Insufficient documentation

## 2023-08-14 DIAGNOSIS — G35A Relapsing-remitting multiple sclerosis: Secondary | ICD-10-CM

## 2023-08-14 DIAGNOSIS — G932 Benign intracranial hypertension: Secondary | ICD-10-CM | POA: Insufficient documentation

## 2023-08-14 DIAGNOSIS — I10 Essential (primary) hypertension: Secondary | ICD-10-CM | POA: Diagnosis not present

## 2023-08-14 HISTORY — DX: Multiple sclerosis: G35

## 2023-08-14 HISTORY — DX: Multiple sclerosis, unspecified: G35.D

## 2023-08-14 LAB — CRYPTOCOCCAL ANTIGEN, CSF: Crypto Ag: NEGATIVE

## 2023-08-14 LAB — CSF CELL COUNT WITH DIFFERENTIAL
Eosinophils, CSF: 0 % (ref 0–1)
Lymphs, CSF: 100 % — ABNORMAL HIGH (ref 40–80)
Monocyte-Macrophage-Spinal Fluid: 0 % — ABNORMAL LOW (ref 15–45)
RBC Count, CSF: 0 /mm3
Segmented Neutrophils-CSF: 0 % (ref 0–6)
Tube #: 4
WBC, CSF: 13 /mm3 (ref 0–5)

## 2023-08-14 LAB — COMPREHENSIVE METABOLIC PANEL
ALT: 15 U/L (ref 0–44)
AST: 13 U/L — ABNORMAL LOW (ref 15–41)
Albumin: 3.6 g/dL (ref 3.5–5.0)
Alkaline Phosphatase: 65 U/L (ref 38–126)
Anion gap: 8 (ref 5–15)
BUN: 17 mg/dL (ref 6–20)
CO2: 23 mmol/L (ref 22–32)
Calcium: 9 mg/dL (ref 8.9–10.3)
Chloride: 108 mmol/L (ref 98–111)
Creatinine, Ser: 0.85 mg/dL (ref 0.44–1.00)
GFR, Estimated: >60 mL/min (ref >60)
Glucose, Bld: 78 mg/dL (ref 70–99)
Potassium: 3.7 mmol/L (ref 3.5–5.1)
Sodium: 139 mmol/L (ref 135–145)
Total Bilirubin: 0.5 mg/dL (ref <1.2)
Total Protein: 7.1 g/dL (ref 6.5–8.1)

## 2023-08-14 LAB — CBC WITH DIFFERENTIAL/PLATELET
Abs Immature Granulocytes: 0.02 10*3/uL (ref 0.00–0.07)
Basophils Absolute: 0.1 10*3/uL (ref 0.0–0.1)
Basophils Relative: 1 %
Eosinophils Absolute: 0.1 10*3/uL (ref 0.0–0.5)
Eosinophils Relative: 1 %
HCT: 39.8 % (ref 36.0–46.0)
Hemoglobin: 12.3 g/dL (ref 12.0–15.0)
Immature Granulocytes: 0 %
Lymphocytes Relative: 29 %
Lymphs Abs: 2.3 10*3/uL (ref 0.7–4.0)
MCH: 27.2 pg (ref 26.0–34.0)
MCHC: 30.9 g/dL (ref 30.0–36.0)
MCV: 87.9 fL (ref 80.0–100.0)
Monocytes Absolute: 0.5 10*3/uL (ref 0.1–1.0)
Monocytes Relative: 6 %
Neutro Abs: 4.9 10*3/uL (ref 1.7–7.7)
Neutrophils Relative %: 63 %
Platelets: 365 10*3/uL (ref 150–400)
RBC: 4.53 MIL/uL (ref 3.87–5.11)
RDW: 14.4 % (ref 11.5–15.5)
WBC: 7.8 10*3/uL (ref 4.0–10.5)
nRBC: 0 % (ref 0.0–0.2)

## 2023-08-14 LAB — PROTEIN AND GLUCOSE, CSF
Glucose, CSF: 48 mg/dL (ref 40–70)
Total  Protein, CSF: 22 mg/dL (ref 15–45)

## 2023-08-14 LAB — HCG, SERUM, QUALITATIVE: Preg, Serum: NEGATIVE

## 2023-08-14 MED ORDER — POLYETHYLENE GLYCOL 3350 17 G PO PACK: 17.0000 g | PACK | Freq: Every day | ORAL | Status: DC | PRN

## 2023-08-14 MED ORDER — SODIUM CHLORIDE 0.9% FLUSH: 3.0000 mL | Freq: Two times a day (BID) | INTRAVENOUS | Status: DC

## 2023-08-14 MED ORDER — OFATUMUMAB 20 MG/0.4ML ~~LOC~~ SOAJ: 20.0000 mg | SUBCUTANEOUS | Status: DC

## 2023-08-14 MED ORDER — PREDNISONE 10 MG PO TABS: 50.0000 mg | ORAL_TABLET | Freq: Every evening | ORAL | Status: DC | PRN

## 2023-08-14 MED ORDER — ACETAMINOPHEN 325 MG PO TABS: 650.0000 mg | ORAL_TABLET | Freq: Four times a day (QID) | ORAL | Status: DC | PRN

## 2023-08-14 MED ORDER — GADOBUTROL 1 MMOL/ML IV SOLN
10.0000 mL | Freq: Once | INTRAVENOUS | Status: AC | PRN
  Administered 2023-08-14: 10 mL via INTRAVENOUS

## 2023-08-14 MED ORDER — ONDANSETRON HCL 4 MG/2ML IJ SOLN: 4.0000 mg | Freq: Once | INTRAMUSCULAR | Status: DC | PRN

## 2023-08-14 MED ORDER — ACETAMINOPHEN 650 MG RE SUPP: 650.0000 mg | Freq: Four times a day (QID) | RECTAL | Status: DC | PRN

## 2023-08-14 NOTE — ED Notes (Signed)
Pt transported to MRI 

## 2023-08-14 NOTE — Assessment & Plan Note (Signed)
This is chronic, patient chronically maintained on ofatumumab and predsnione.  At this time no evidence of flare.  I suspect patient's minimal lymphocytic pleocytosis of CSF is due to her multiple sclerosis itself.  However patient is pending viral encephalitis panel as well as rule out fungal meningitis.

## 2023-08-14 NOTE — ED Provider Notes (Signed)
EMERGENCY DEPARTMENT AT [facility] Provider Note   CSN: [id number] Arrival date & time: 08/14/23  0815     History  Chief Complaint  Patient presents with   Eye Problem    Dana Woods is a 22 y.o. female.  HPI 22 year old female with a history of multiple sclerosis presents for MRIs at the referral of her ophthalmologist.  Patient had a regular eye checkup 3 days ago and according to the notes she was found to have bilateral papilledema.  She states that she had her glasses prescription renewed but did not have to have anything changed.  She states she has been dealing with on and off headaches that are responsive to ibuprofen for over a year.  She specifically denies any new vision complaints or eye pain.  She denies any weakness, numbness or recent vomiting.  Right now she has no symptoms.  She is currently on steroids and Kesimpta for her MS.  Home Medications Prior to Admission medications   Medication Sig Start Date End Date Taking? Authorizing Provider  Norethindrone Acetate-Ethinyl Estrad-FE (LOESTRIN 24 FE) 1-20 MG-MCG(24) tablet Take 1 tablet by mouth daily. Patient not taking: Reported on 01/11/2023 03/10/21   Anyanwu, Jethro Bastos, MD  Ofatumumab (KESIMPTA) 20 MG/0.4ML SOAJ Inject 20 mg into the skin every 28 (twenty-eight) days. Patient not taking: Reported on 06/07/2023    Asa Lente, MD  phentermine 37.5 MG capsule Take 1 capsule (37.5 mg total) by mouth every morning. Patient not taking: Reported on 06/07/2023 02/01/23   Sater, Pearletha Furl, MD  polyethylene glycol (MIRALAX) 17 g packet Take 17 g by mouth daily. Patient not taking: Reported on 01/11/2023 10/18/22   Franne Forts, PA-C  predniSONE (DELTASONE) 50 MG tablet Take one po the night before your Kesimpta injection 06/07/23   Sater, Pearletha Furl, MD  Vitamin D, Ergocalciferol, (DRISDOL) 1.25 MG (50000 UNIT) CAPS capsule Take 1 capsule (50,000 Units total) by mouth every 7 (seven) days. Patient  not taking: Reported on 06/07/2023 01/19/23   Almon Hercules, MD  FLUoxetine (PROZAC) 20 MG capsule Take 1 capsule (20 mg total) by mouth daily. 02/08/19 07/13/19  Theadore Nan, MD  ipratropium (ATROVENT) 0.06 % nasal spray Place 2 sprays into both nostrils 3 (three) times daily. 11/02/17 07/13/19  Georgetta Haber, NP      Allergies    Gadavist [gadobutrol] and Nickel    Review of Systems   Review of Systems  Eyes:  Negative for visual disturbance.  Gastrointestinal:  Negative for vomiting.  Neurological:  Positive for headaches. Negative for weakness and numbness.    Physical Exam Updated Vital Signs BP (!) 140/126   Pulse 65   Temp 98.2 F (36.8 C) (Oral)   Resp 18   Ht 5\' 6"  (1.676 m)   Wt (!) 142.4 kg   LMP 07/19/2023 (Approximate)   SpO2 100%   BMI 50.68 kg/m  Physical Exam Vitals and nursing note reviewed.  Constitutional:      General: She is not in acute distress.    Appearance: She is well-developed. She is obese. She is not ill-appearing or diaphoretic.  HENT:     Head: Normocephalic and atraumatic.  Eyes:     Extraocular Movements: Extraocular movements intact.     Pupils: Pupils are equal, round, and reactive to light.     Comments: Grossly normal visual fields  Cardiovascular:     Rate and Rhythm: Normal rate and regular rhythm.  Heart sounds: Normal heart sounds.  Pulmonary:     Effort: Pulmonary effort is normal.     Breath sounds: Normal breath sounds.  Skin:    General: Skin is warm and dry.  Neurological:     Mental Status: She is alert.     Comments: CN 3-12 grossly intact. 5/5 strength in all 4 extremities. Grossly normal sensation. Normal finger to nose.      ED Results / Procedures / Treatments   Labs (all labs ordered are listed, but only abnormal results are displayed) Labs Reviewed  COMPREHENSIVE METABOLIC PANEL - Abnormal; Notable for the following components:      Result Value   AST 13 (*)    All other components within  normal limits  CSF CELL COUNT WITH DIFFERENTIAL - Abnormal; Notable for the following components:   WBC, CSF 13 (*)    All other components within normal limits  CSF CULTURE W GRAM STAIN  FUNGUS CULTURE WITH STAIN  CBC WITH DIFFERENTIAL/PLATELET  HCG, SERUM, QUALITATIVE  PROTEIN AND GLUCOSE, CSF  CRYPTOCOCCAL ANTIGEN, CSF  MENINGITIS/ENCEPHALITIS PANEL (CSF)  VDRL, CSF    EKG None  Radiology DG Lumbar Puncture Fluoro Guide Result Date: 08/14/2023 CLINICAL DATA:  Papilledema. Concern for idiopathic intracranial hypertension. EXAM: DIAGNOSTIC LUMBAR PUNCTURE UNDER FLUOROSCOPIC GUIDANCE FLUOROSCOPY: Radiation Exposure Index (as provided by the fluoroscopic device): 17.8 PROCEDURE: Informed consent was obtained from the patient prior to the procedure, including potential complications of headache, allergy, and pain. With the patient prone, the lower back was prepped with Betadine. 1% Lidocaine was used for local anesthesia. Lumbar puncture was performed at the L2-L3 level using a 20 gauge needle with return of clear CSF with an opening pressure of 32 cm water. 12 ml of CSF were obtained for laboratory studies. The patient tolerated the procedure well and there were no apparent complications. IMPRESSION: 1. Successful lumbar puncture. 2. Elevated opening pressure at 32 cm water. 3. Closing pressure improved to 22 cm water Electronically Signed   By: Genevive Bi M.D.   On: 08/14/2023 16:32   MR Brain W and Wo Contrast Result Date: 08/14/2023 CLINICAL DATA:  Provided history: Optic neuritis suspected. Additional history obtained from electronic MEDICAL RECORD NUMBERHistory of multiple sclerosis. EXAM: MRI HEAD AND ORBITS WITHOUT AND WITH CONTRAST TECHNIQUE: Multiplanar, multiecho pulse sequences of the brain and surrounding structures were obtained without and with intravenous contrast. Multiplanar, multiecho pulse sequences of the orbits and surrounding structures were obtained including fat  saturation techniques, before and after intravenous contrast administration. Angiographic images of the intracranial venous structures were acquired using MRV technique without and with intravenous contrast. CONTRAST:  10mL GADAVIST GADOBUTROL 1 MMOL/ML IV SOLN COMPARISON:  Brain MRI 01/11/2023. FINDINGS: MRI HEAD FINDINGS Brain: No age advanced or lobar predominant parenchymal atrophy. Multifocal T2 FLAIR hyperintense signal abnormality within the bilateral juxtacortical and deep periventricular cerebral white matter, as well as pons, compatible with the provided history of multiple sclerosis. As compared to the prior brain MRI of 01/11/2023, a few new lesions are identified within the cerebral white matter. Most notably, there is a new 12 mm lesion within the right frontoparietal periventricular white matter (for instance as seen on series 9, image 29) and a new 15 mm lesion within the left parietal periventricular white matter (for instance as seen on series 9, image 26). No pathologic intracranial enhancement to suggest active demyelination. There is no acute infarct. No evidence of an intracranial mass. No chronic intracranial blood products. No extra-axial fluid  collection. No midline shift. Vascular: Maintained flow voids within the proximal large arterial vessels. Skull and upper cervical spine: Abnormal T1 hypointense marrow signal within visualized portions of the cervical spine. MRV HEAD FINDINGS The superior sagittal sinus, internal cerebral veins, vein of Galen, straight sinus, transverse sinuses, sigmoid sinuses and visualized jugular veins are patent. There is no appreciable intracranial venous thrombosis. Narrowed appearance of the distal transverse dural venous sinuses bilaterally. MRI ORBITS FINDINGS Orbits: The globes are normal in size and contour. Vertical tortuosity of the optic nerves bilaterally. The extraocular muscles, optic nerve sheath complexes and lacrimal glands are otherwise symmetric  and unremarkable. No orbital mass or pathologic orbital enhancement. Specifically, there is no evidence of acute optic neuritis. Visualized sinuses: Mild mucosal thickening within the right maxillary sinus. Mild mucosal thickening, and small mucous retention cyst, within the left maxillary sinus. Soft tissues: The visualized maxillofacial and upper neck soft tissues are unremarkable. IMPRESSION: MRI brain: 1. No evidence of an acute intracranial abnormality. 2. Multifocal T2 FLAIR hyperintense signal abnormality within the cerebral white matter and pons consistent with the provided history of multiple sclerosis. As compared to the prior brain MRI of 01/11/2023, a few new lesions are identified within the cerebral white matter (measuring up to 15 mm). No pathologic intracranial enhancement to suggest active demyelination. 3. Abnormal T1 hypointense marrow signal within imaged portions of the cervical spine. While this finding can reflect a marrow infiltrative process, the most common causes include chronic anemia, smoking and obesity. MRV head: 1. Narrowed appearance of the distal transverse dural venous sinuses bilaterally. This finding can be seen in the setting of chronic idiopathic intracranial hypertension (pseudotumor cerebri). 2. No evidence of dural venous sinus thrombosis. MRI orbits: 1. Vertical tortuosity of the optic nerves bilaterally. This finding can be seen in the setting of chronic idiopathic intracranial hypertension (pseudotumor cerebri). 2. Otherwise unremarkable MRI appearance of the orbits. 3. Mild bilateral maxillary sinus disease. Electronically Signed   By: Jackey Loge D.O.   On: 08/14/2023 12:56   MR ORBITS W WO CONTRAST Result Date: 08/14/2023 CLINICAL DATA:  Provided history: Optic neuritis suspected. Additional history obtained from electronic MEDICAL RECORD NUMBERHistory of multiple sclerosis. EXAM: MRI HEAD AND ORBITS WITHOUT AND WITH CONTRAST TECHNIQUE: Multiplanar, multiecho pulse  sequences of the brain and surrounding structures were obtained without and with intravenous contrast. Multiplanar, multiecho pulse sequences of the orbits and surrounding structures were obtained including fat saturation techniques, before and after intravenous contrast administration. Angiographic images of the intracranial venous structures were acquired using MRV technique without and with intravenous contrast. CONTRAST:  10mL GADAVIST GADOBUTROL 1 MMOL/ML IV SOLN COMPARISON:  Brain MRI 01/11/2023. FINDINGS: MRI HEAD FINDINGS Brain: No age advanced or lobar predominant parenchymal atrophy. Multifocal T2 FLAIR hyperintense signal abnormality within the bilateral juxtacortical and deep periventricular cerebral white matter, as well as pons, compatible with the provided history of multiple sclerosis. As compared to the prior brain MRI of 01/11/2023, a few new lesions are identified within the cerebral white matter. Most notably, there is a new 12 mm lesion within the right frontoparietal periventricular white matter (for instance as seen on series 9, image 29) and a new 15 mm lesion within the left parietal periventricular white matter (for instance as seen on series 9, image 26). No pathologic intracranial enhancement to suggest active demyelination. There is no acute infarct. No evidence of an intracranial mass. No chronic intracranial blood products. No extra-axial fluid collection. No midline shift. Vascular: Maintained flow voids within  the proximal large arterial vessels. Skull and upper cervical spine: Abnormal T1 hypointense marrow signal within visualized portions of the cervical spine. MRV HEAD FINDINGS The superior sagittal sinus, internal cerebral veins, vein of Galen, straight sinus, transverse sinuses, sigmoid sinuses and visualized jugular veins are patent. There is no appreciable intracranial venous thrombosis. Narrowed appearance of the distal transverse dural venous sinuses bilaterally. MRI ORBITS  FINDINGS Orbits: The globes are normal in size and contour. Vertical tortuosity of the optic nerves bilaterally. The extraocular muscles, optic nerve sheath complexes and lacrimal glands are otherwise symmetric and unremarkable. No orbital mass or pathologic orbital enhancement. Specifically, there is no evidence of acute optic neuritis. Visualized sinuses: Mild mucosal thickening within the right maxillary sinus. Mild mucosal thickening, and small mucous retention cyst, within the left maxillary sinus. Soft tissues: The visualized maxillofacial and upper neck soft tissues are unremarkable. IMPRESSION: MRI brain: 1. No evidence of an acute intracranial abnormality. 2. Multifocal T2 FLAIR hyperintense signal abnormality within the cerebral white matter and pons consistent with the provided history of multiple sclerosis. As compared to the prior brain MRI of 01/11/2023, a few new lesions are identified within the cerebral white matter (measuring up to 15 mm). No pathologic intracranial enhancement to suggest active demyelination. 3. Abnormal T1 hypointense marrow signal within imaged portions of the cervical spine. While this finding can reflect a marrow infiltrative process, the most common causes include chronic anemia, smoking and obesity. MRV head: 1. Narrowed appearance of the distal transverse dural venous sinuses bilaterally. This finding can be seen in the setting of chronic idiopathic intracranial hypertension (pseudotumor cerebri). 2. No evidence of dural venous sinus thrombosis. MRI orbits: 1. Vertical tortuosity of the optic nerves bilaterally. This finding can be seen in the setting of chronic idiopathic intracranial hypertension (pseudotumor cerebri). 2. Otherwise unremarkable MRI appearance of the orbits. 3. Mild bilateral maxillary sinus disease. Electronically Signed   By: Jackey Loge D.O.   On: 08/14/2023 12:56   MR MRV HEAD W WO CONTRAST Result Date: 08/14/2023 CLINICAL DATA:  Provided history:  Optic neuritis suspected. Additional history obtained from electronic MEDICAL RECORD NUMBERHistory of multiple sclerosis. EXAM: MRI HEAD AND ORBITS WITHOUT AND WITH CONTRAST TECHNIQUE: Multiplanar, multiecho pulse sequences of the brain and surrounding structures were obtained without and with intravenous contrast. Multiplanar, multiecho pulse sequences of the orbits and surrounding structures were obtained including fat saturation techniques, before and after intravenous contrast administration. Angiographic images of the intracranial venous structures were acquired using MRV technique without and with intravenous contrast. CONTRAST:  10mL GADAVIST GADOBUTROL 1 MMOL/ML IV SOLN COMPARISON:  Brain MRI 01/11/2023. FINDINGS: MRI HEAD FINDINGS Brain: No age advanced or lobar predominant parenchymal atrophy. Multifocal T2 FLAIR hyperintense signal abnormality within the bilateral juxtacortical and deep periventricular cerebral white matter, as well as pons, compatible with the provided history of multiple sclerosis. As compared to the prior brain MRI of 01/11/2023, a few new lesions are identified within the cerebral white matter. Most notably, there is a new 12 mm lesion within the right frontoparietal periventricular white matter (for instance as seen on series 9, image 29) and a new 15 mm lesion within the left parietal periventricular white matter (for instance as seen on series 9, image 26). No pathologic intracranial enhancement to suggest active demyelination. There is no acute infarct. No evidence of an intracranial mass. No chronic intracranial blood products. No extra-axial fluid collection. No midline shift. Vascular: Maintained flow voids within the proximal large arterial vessels. Skull and upper  cervical spine: Abnormal T1 hypointense marrow signal within visualized portions of the cervical spine. MRV HEAD FINDINGS The superior sagittal sinus, internal cerebral veins, vein of Galen, straight sinus, transverse  sinuses, sigmoid sinuses and visualized jugular veins are patent. There is no appreciable intracranial venous thrombosis. Narrowed appearance of the distal transverse dural venous sinuses bilaterally. MRI ORBITS FINDINGS Orbits: The globes are normal in size and contour. Vertical tortuosity of the optic nerves bilaterally. The extraocular muscles, optic nerve sheath complexes and lacrimal glands are otherwise symmetric and unremarkable. No orbital mass or pathologic orbital enhancement. Specifically, there is no evidence of acute optic neuritis. Visualized sinuses: Mild mucosal thickening within the right maxillary sinus. Mild mucosal thickening, and small mucous retention cyst, within the left maxillary sinus. Soft tissues: The visualized maxillofacial and upper neck soft tissues are unremarkable. IMPRESSION: MRI brain: 1. No evidence of an acute intracranial abnormality. 2. Multifocal T2 FLAIR hyperintense signal abnormality within the cerebral white matter and pons consistent with the provided history of multiple sclerosis. As compared to the prior brain MRI of 01/11/2023, a few new lesions are identified within the cerebral white matter (measuring up to 15 mm). No pathologic intracranial enhancement to suggest active demyelination. 3. Abnormal T1 hypointense marrow signal within imaged portions of the cervical spine. While this finding can reflect a marrow infiltrative process, the most common causes include chronic anemia, smoking and obesity. MRV head: 1. Narrowed appearance of the distal transverse dural venous sinuses bilaterally. This finding can be seen in the setting of chronic idiopathic intracranial hypertension (pseudotumor cerebri). 2. No evidence of dural venous sinus thrombosis. MRI orbits: 1. Vertical tortuosity of the optic nerves bilaterally. This finding can be seen in the setting of chronic idiopathic intracranial hypertension (pseudotumor cerebri). 2. Otherwise unremarkable MRI appearance of  the orbits. 3. Mild bilateral maxillary sinus disease. Electronically Signed   By: Jackey Loge D.O.   On: 08/14/2023 12:56    Procedures .Ultrasound ED Peripheral IV (Provider)  Date/Time: 08/14/2023 10:53 AM  Performed by: Pricilla Loveless, MD Authorized by: Pricilla Loveless, MD   Procedure details:    Indications: multiple failed IV attempts     Skin Prep: chlorhexidine gluconate     Location:  Right AC   Angiocath:  20 G   Bedside Ultrasound Guided: Yes     Patient tolerated procedure without complications: Yes     Dressing applied: Yes       Medications Ordered in ED Medications  ondansetron (ZOFRAN) injection 4 mg (has no administration in time range)  gadobutrol (GADAVIST) 1 MMOL/ML injection 10 mL (10 mLs Intravenous Contrast Given 08/14/23 1114)    ED Course/ Medical Decision Making/ A&P Clinical Course as of 08/14/23 1723  Mon Aug 14, 2023  1714 Received sign out from Dr. Criss Alvine, went to optho and had papilledema. Had MRI/LP. Having headaches for one year. 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 Neurology wants patient admitted [facility].  [WS]    Clinical Course User Index [WS] Lonell Grandchild, MD                                 Medical Decision Making Amount and/or Complexity of Data Reviewed Labs: ordered.    Details: Normal WBC.  However CSF has 13 white blood cells which is abnormal. Radiology: ordered and independent interpretation performed.    Details: No active demyelination  Risk Prescription drug management.  Decision regarding hospitalization.   Patient is asymptomatic except for the fact of having on and off headaches for over a year.  No current vision symptoms.  Ultimately MRIs do not show an obvious cause so LP was performed by radiology.  The opening pressure was 32 which could be consistent with IIH.  However her CSF white blood cell count is 13.  Discussed with Dr. Otelia Limes, who recommends adding on  meningitis/encephalitis panel, cryptococcal antigen, VDRL, and fungus culture.  She should be admitted to the hospital to be watched.  He would like her admitted to [facility] and for neurology to be consulted when she gets there.  Otherwise patient seems asymptomatic and does not have signs or symptoms of meningitis at this time including no meningismus, fever, etc. Care transferred to Dr. Suezanne Jacquet.        Final Clinical Impression(s) / ED Diagnoses Final diagnoses:  Papilledema    Rx / DC Orders ED Discharge Orders     None         Pricilla Loveless, MD 08/14/23 1724

## 2023-08-14 NOTE — Consult Note (Addendum)
NEUROLOGY CONSULT NOTE   Date of service: August 14, 2023 Patient Name: Dana Woods MRN:  [id number] DOB:  16-Aug-2001 Chief Complaint: "Vision issues" Requesting Provider: Nolberto Hanlon, MD  History of Present Illness  Dana Woods is a 22 y.o. 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   Presented to the hospital after being diagnosed with bilateral papilledema by ophthalmologist and sent in for further evaluation.  Seen at the [facility] emergency room where brain imaging was done with no indication of enhancing lesions.  Case was discussed with on-call neurologist who recommended a spinal tap which revealed an opening pressure of 32 cm of water and after draining 12 cc of fluid, closing pressure was 22 cm of water.  On-call neurologist at the time recommended admission for further workup on the CSF and ensuring there is no evidence of infection. Patient complains of ongoing headaches for multiple months at this time and was asked to also get regular eye checkup, which revealed a bilateral papilledema.  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  Denies any recent illness or sickness.  Denies fever chills.   ROS  Comprehensive ROS performed and pertinent positives documented in HPI   Past History   Past Medical History:  Diagnosis Date   Asthma    At risk for diabetes mellitus 12/20/2009   HbA1c 5.7   Hypercholesteremia 12/20/2009   LDL 116, totat 181   Irregular menses    Multiple sclerosis (HCC)    Myopia of both eyes 12/21/2011   has glasses   Obesity 08/23/2007   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    Past Surgical History:  Procedure Laterality Date   CHOLECYSTECTOMY  Bilateral 10/18/2022   Procedure: LAPAROSCOPIC CHOLECYSTECTOMY WITH INTRAOPERATIVE CHOLANGIOGRAM;  Surgeon: Gaynelle Adu, MD;  Location: [facility];  Service: General;  Laterality: Bilateral;    Family History: Family History  Problem Relation Age of Onset   ADD / ADHD Sister     Social History  reports that she has been smoking cigars. She has never used smokeless tobacco. She reports that she does not currently use alcohol. She reports that she does not use drugs.  Allergies  Allergen Reactions   Gadavist [Gadobutrol] Nausea Only    Nausea right after contrast was given. No other reaction.    Nickel Rash    Medications   Current Facility-Administered Medications:    acetaminophen (TYLENOL) tablet 650 mg, 650 mg, Oral, Q6H PRN **OR** acetaminophen (TYLENOL) suppository 650 mg, 650 mg, Rectal, Q6H PRN, Nolberto Hanlon, MD   ondansetron (ZOFRAN) injection 4 mg, 4 mg, Intravenous, Once PRN, Nolberto Hanlon, MD   polyethylene glycol (MIRALAX / GLYCOLAX) packet 17 g, 17 g, Oral, Daily PRN, Nolberto Hanlon, MD   sodium chloride flush (NS) 0.9 % injection 3 mL, 3 mL, Intravenous, Q12H, Nolberto Hanlon, MD  Vitals   Vitals:   08/14/23 1800 08/14/23 1845 08/14/23 1900 08/14/23 1943  BP: 111/74 98/76 (!) 110/92 113/75  Pulse: 73 73 64 76  Resp:    17  Temp:    98 F (36.7 C)  TempSrc:    Oral  SpO2: 100% 100% 100% 100%  Weight:      Height:        Body  mass index is 50.68 kg/m.  Physical Exam   General: Well-developed well-nourished obese woman in no apparent distress HEENT: Normocephalic atraumatic Lungs: Clear Cardiovascular: Regular rhythm Neurological exam Awake alert oriented x 3, no dysarthria, no aphasia. CN II to XII intact Motor examination with no drift Sensation intact to light touch Coordination exam with no dysmetria   Labs/Imaging/Neurodiagnostic studies   CBC:  Recent Labs  Lab 08/25/2023 1541 08/14/23 1050  WBC 6.5 7.8  NEUTROABS 3.9 4.9  HGB 12.5 12.3  HCT 39.9  39.8  MCV 87.5 87.9  PLT 347 365   Basic Metabolic Panel:  Lab Results  Component Value Date   NA 139 08/14/2023   K 3.7 08/14/2023   CO2 23 08/14/2023   GLUCOSE 78 08/14/2023   BUN 17 08/14/2023   CREATININE 0.85 08/14/2023   CALCIUM 9.0 08/14/2023   GFRNONAA >60 08/14/2023   Lipid Panel:  Lab Results  Component Value Date   LDLCALC 119 (H) 01/12/2023   HgbA1c:  Lab Results  Component Value Date   HGBA1C 5.0 01/12/2023   CSF: RBC 0, WBC 13-100% lymphocytes.  Total protein 22.  Only run on tube 4. Meningitis encephalitis panel pending.  CSF VDRL, fungal culture and cryptococcal antigen pending.   MRI Brain(Personally reviewed): With and without contrast.  No evidence of acute intracranial abnormality.  Few new lesions compared to the scan of 01/11/2023 but no pathologic intracranial enhancement to suggest active demyelination.  Abnormal T1 hide more intense marrow signal within the imaged portion of the C-spine.  Likely reflective of obesity or chronic anemia.   MRV head personally reviewed-narrowed appearance of the distal transverse dural venous sinus bilaterally-can be seen in pseudotumor cerebri. MRI of the orbits personally reviewed-vertical tortuosity of the optic nerves bilaterally-this finding can also be seen in pseudotumor cerebri-otherwise unremarkable MRI appearance of the orbits.  Mild bilateral maxillary sinus disease   ASSESSMENT   Dana Woods is a 22 y.o. 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  Presented for evaluation of bilateral papilledema to the emergency department.  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 On-call neurologist recommended testing CSF including infection rule out which is still pending.  No evidence of dural venous sinus thrombus. Once those tests come back and if they are negative, no further infectious workup is needed She will need to be started on acetazolamide.  Impression Idiopathic intracranial hypertension History of MS with no evidence of exacerbation based on brain imaging or clinical history.   RECOMMENDATIONS  Await CSF tests as above that are pending Frequent neurochecks If any of the above infectious workup comes back positive, may need to involve ID for further recommendations.  The my leukocytosis with a lymphocytic predominant pleocytosis is likely related to the inflammatory process that is known to be not very uncommon with MS. If the workup above is negative,Start acetazolamide 250 mg twice daily. Follow-up with outpatient neurology in the next 2 to 4 weeks-she is an established patient of [facility]. Preliminary plan was discussed with the admitting hospitalist via secure chat. Neurology will follow the above test results with you. ______________________________________________________________________    Signed, Milon Dikes, MD [facility]

## 2023-08-14 NOTE — ED Triage Notes (Signed)
Patient had an eye exam Friday. Was sent here for MRI of brain and optic nerves. Has multiple sclerosis. Was told she has swelling behind both eyes. Denies changes in vision. Has had headaches on and off for long periods of time.

## 2023-08-14 NOTE — ED Provider Notes (Signed)
  ED Course / MDM   Clinical Course as of 08/14/23 1741  Mon Aug 14, 2023  1714 Received sign out from Dr. Criss Alvine, went to optho and had papilledema. Had MRI/LP. Having headaches for one year. 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 Neurology wants patient admitted to [facility].  [WS]  1741 Signed out to Dr. Maryjean Ka.  [WS]    Clinical Course User Index [WS] Suezanne Jacquet Jerilee Field, MD   Medical Decision Making Amount and/or Complexity of Data Reviewed Labs: ordered. Radiology: ordered.  Risk Prescription drug management. Decision regarding hospitalization.          Lonell Grandchild, MD 08/14/23 [phone]

## 2023-08-14 NOTE — ED Notes (Signed)
Phone report was given to receiving RN. [facility] was called and transportation arranged.

## 2023-08-14 NOTE — Assessment & Plan Note (Signed)
Suspected to be due to idiopathic intracranial hypertension in the setting of marked obesity.  Patient had LP done today with improvement in CSF pressure.  Patient has no gait abnormality.  Look forward to input from neurology in this case as well.  I will check cortisol and TSH in the morning.

## 2023-08-14 NOTE — H&P (Signed)
History and Physical    Patient: UNKNOWN Dana Woods FAO:[id number] DOB: 2001/03/11 DOA: 08/14/2023 DOS: the patient was seen and examined on 08/14/2023 PCP: Grayce Sessions, NP  Patient coming from: Home> opthalmonologist  Chief Complaint:  Chief Complaint  Patient presents with   Eye Problem   HPI: Dana Woods is a 22 y.o. female with medical history significant of multiple sclerosis.  Patient does take disease modifying agent subcutaneously every month.  Patient has had a chronic headache for almost a year in the occipital area present most days of the week without any aggravating relieving factor or precipitating factor.  Usually mild.  As part of the workup patient had an ophthalmology visit recently with finding of papilledema.  Patient subsequently had MRI done here in the ER at [facility] with no flare findings.  Patient subsequently was referred to radiology for lumbar puncture with finding of elevated opening pressure of 32 cm of water.  Closing pressure was 22 cm water.  Patient is currently asymptomatic.  CSF cell count of 13 was obtained with lymphocyte predominance.  Case was discussed with neurology on-call by the ER attending and further studies such as cryptococcal antigen, fungus culture, as well as a viral encephalitis panel is pending.  Patient denies any fever vomiting low back pain trauma.  Patient is disappointed/tearful about having to stay the night in the hospital.  Mom at the bedside.  Medical evaluation is sought. Review of Systems: As mentioned in the history of present illness. All other systems reviewed and are negative. 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 She has never used smokeless tobacco. She reports that she does not currently use alcohol. She reports that she does not use drugs.  Allergies  Allergen Reactions   Gadavist [Gadobutrol] Nausea Only    Nausea right after contrast was given. No other reaction.    Nickel Rash    Family History  Problem Relation Age of Onset   ADD / ADHD Sister     Prior to Admission medications   Medication Sig Start Date End Date Taking? Authorizing Provider  Ofatumumab (KESIMPTA) 20 MG/0.4ML SOAJ Inject 20 mg into the skin once a week.   Yes Sater, Pearletha Furl, MD  predniSONE (DELTASONE) 50 MG tablet Take one po the night before your Kesimpta injection 06/07/23  Yes Sater, Pearletha Furl, MD  phentermine 37.5 MG capsule Take 1 capsule (37.5 mg total) by mouth every morning. Patient not taking: Reported on 06/07/2023 02/01/23   Asa Lente, MD  FLUoxetine (PROZAC) 20 MG capsule Take 1 capsule (20 mg total) by mouth daily. 02/08/19 07/13/19  Theadore Nan, MD  ipratropium (ATROVENT) 0.06 % nasal spray Place 2 sprays into both nostrils 3 (three) times daily. 11/02/17 07/13/19  Georgetta Haber, NP    Physical Exam: Vitals:   08/14/23 1600 08/14/23 1639 08/14/23 1700  08/14/23 1800  BP: 108/65  109/67 111/74  Pulse: 61  (!) 59 73  Resp:      Temp:  98.2 F (36.8 C)    TempSrc:  Oral    SpO2: 100%  100% 100%  Weight:      Height:       General: Alert awake oriented x 3 no physiologic distress Respiratory exam: Bilateral intravesicular Cardiovascular exam S1 is normal Abdomen soft nontender Extremities warm without edema Mom at the bedside.  No nuchal rigidity Data Reviewed:  Labs on Admission:  Results  for orders placed or performed during the hospital encounter of 08/14/23 (from the past 24 hours)  Comprehensive metabolic panel     Status: Abnormal   Collection Time: 08/14/23 10:50 AM  Result Value Ref Range   Sodium 139 135 - 145 mmol/L   Potassium 3.7 3.5 - 5.1 mmol/L   Chloride 108 98 - 111 mmol/L   CO2 23 22 - 32 mmol/L   Glucose, Bld 78 70 - 99 mg/dL   BUN 17 6 - 20 mg/dL   Creatinine, Ser 0.98 0.44 - 1.00 mg/dL   Calcium 9.0 8.9 - 11.9 mg/dL   Total Protein 7.1 6.5 - 8.1 g/dL   Albumin 3.6 3.5 - 5.0 g/dL   AST 13 (L) 15 - 41 U/L   ALT 15 0 - 44 U/L   Alkaline Phosphatase 65 38 - 126 U/L   Total Bilirubin 0.5 <1.2 mg/dL   GFR, Estimated >14 >78 mL/min   Anion gap 8 5 - 15  CBC with Differential     Status: None   Collection Time: 08/14/23 10:50 AM  Result Value Ref Range   WBC 7.8 4.0 - 10.5 K/uL   RBC 4.53 3.87 - 5.11 MIL/uL   Hemoglobin 12.3 12.0 - 15.0 g/dL   HCT 29.5 62.1 - 30.8 %   MCV 87.9 80.0 - 100.0 fL   MCH 27.2 26.0 - 34.0 pg   MCHC 30.9 30.0 - 36.0 g/dL   RDW 65.7 84.6 - 96.2 %   Platelets 365 150 - 400 K/uL   nRBC 0.0 0.0 - 0.2 %   Neutrophils Relative % 63 %   Neutro Abs 4.9 1.7 - 7.7 K/uL   Lymphocytes Relative 29 %   Lymphs Abs 2.3 0.7 - 4.0 K/uL   Monocytes Relative 6 %   Monocytes Absolute 0.5 0.1 - 1.0 K/uL   Eosinophils Relative 1 %   Eosinophils Absolute 0.1 0.0 - 0.5 K/uL   Basophils Relative 1 %   Basophils Absolute 0.1 0.0 - 0.1 K/uL   Immature Granulocytes 0 %   Abs Immature Granulocytes 0.02 0.00 - 0.07 K/uL  hCG, serum, qualitative     Status: None   Collection Time: 08/14/23 10:50 AM  Result Value Ref Range   Preg, Serum NEGATIVE NEGATIVE  CSF cell count with differential     Status: Abnormal   Collection Time: 08/14/23  3:26 PM  Result Value Ref Range   Tube # 4    Color, CSF COLORLESS COLORLESS   Appearance, CSF CLEAR CLEAR   RBC Count, CSF 0 0 /cu mm   WBC, CSF 13 (HH) 0 - 5 /cu mm   Segmented Neutrophils-CSF 0 0 - 6 %    Lymphs, CSF 100 (H) 40 - 80 %   Monocyte-Macrophage-Spinal Fluid 0 (L) 15 - 45 %   Eosinophils, CSF 0 0 - 1 %  CSF culture w Stat Gram Stain  Status: None (Preliminary result)   Collection Time: 08/14/23  3:26 PM   Specimen: CSF; Cerebrospinal Fluid  Result Value Ref Range   Specimen Description CSF    Special Requests NONE    Gram Stain      WBC SEEN NO ORGANISMS SEEN CYTOSPIN SMEAR Performed at [facility], [address], [place], [place] [address]    Culture PENDING    Report Status PENDING   Protein and glucose, CSF     Status: None   Collection Time: 08/14/23  3:26 PM  Result Value Ref Range   Glucose, CSF 48 40 - 70 mg/dL   Total  Protein, CSF 22 15 - 45 mg/dL  Differential     Status: None (Preliminary result)   Collection Time: 08/14/23  3:26 PM  Result Value Ref Range   Neutro Abs PENDING 1.7 - 7.7 K/uL   Lymphs Abs PENDING 0.7 - 4.0 K/uL   Monocytes Absolute PENDING 0.1 - 1.0 K/uL   Eosinophils Absolute PENDING 0.0 - 0.5 K/uL   Basophils Absolute PENDING 0.0 - 0.1 K/uL   Abs Immature Granulocytes PENDING 0.00 - 0.07 K/uL   Neutrophils Relative % 0 %   Lymphocytes Relative 100 %   Monocytes Relative 0 %   Eosinophils Relative 0 %   Basophils Relative 0 %   Band Neutrophils 0 %   Immature Granulocytes 0 %   Metamyelocytes Relative 0 %   Myelocytes 0 %   Promyelocytes Relative 0 %   Blasts 0 %   nRBC 0 0 /100 WBC   Other 0 %   Basic Metabolic Panel: Recent Labs  Lab 08/11/23 1541 08/14/23 1050  NA 137 139  K 4.2 3.7  CL 108 108  CO2 21* 23  GLUCOSE 91 78  BUN 14 17  CREATININE 0.76 0.85  CALCIUM 9.0 9.0   Liver Function Tests: Recent Labs  Lab 08/14/23 1050  AST 13*  ALT 15  ALKPHOS 65  BILITOT 0.5  PROT 7.1  ALBUMIN 3.6   No results for input(s): "LIPASE", "AMYLASE" in the last 168 hours. No results for input(s): "AMMONIA" in the last 168 hours. CBC: Recent Labs  Lab 08/11/23 1541 08/14/23 1050 08/14/23 1526   WBC 6.5 7.8  --   NEUTROABS 3.9 4.9 PENDING  HGB 12.5 12.3  --   HCT 39.9 39.8  --   MCV 87.5 87.9  --   PLT 347 365  --    Cardiac Enzymes: No results for input(s): "CKTOTAL", "CKMB", "CKMBINDEX", "TROPONINIHS" in the last 168 hours.  BNP (last 3 results) No results for input(s): "PROBNP" in the last 8760 hours. CBG: No results for input(s): "GLUCAP" in the last 168 hours.  Radiological Exams on Admission:  DG Lumbar Puncture Fluoro Guide Result Date: 08/14/2023 CLINICAL DATA:  Papilledema. Concern for idiopathic intracranial hypertension. EXAM: DIAGNOSTIC LUMBAR PUNCTURE UNDER FLUOROSCOPIC GUIDANCE FLUOROSCOPY: Radiation Exposure Index (as provided by the fluoroscopic device): 17.8 PROCEDURE: Informed consent was obtained from the patient prior to the procedure, including potential complications of headache, allergy, and pain. With the patient prone, the lower back was prepped with Betadine. 1% Lidocaine was used for local anesthesia. Lumbar puncture was performed at the L2-L3 level using a 20 gauge needle with return of clear CSF with an opening pressure of 32 cm water. 12 ml of CSF were obtained for laboratory studies. The patient tolerated the procedure well and there were no apparent complications. IMPRESSION: 1. Successful lumbar puncture. 2. Elevated  opening pressure at 32 cm water. 3. Closing pressure improved to 22 cm water Electronically Signed   By: Genevive Bi M.D.   On: 08/14/2023 16:32   MR Brain W and Wo Contrast Result Date: 08/14/2023 CLINICAL DATA:  Provided history: Optic neuritis suspected. Additional history obtained from electronic MEDICAL RECORD NUMBERHistory of multiple sclerosis. EXAM: MRI HEAD AND ORBITS WITHOUT AND WITH CONTRAST TECHNIQUE: Multiplanar, multiecho pulse sequences of the brain and surrounding structures were obtained without and with intravenous contrast. Multiplanar, multiecho pulse sequences of the orbits and surrounding structures were obtained  including fat saturation techniques, before and after intravenous contrast administration. Angiographic images of the intracranial venous structures were acquired using MRV technique without and with intravenous contrast. CONTRAST:  10mL GADAVIST GADOBUTROL 1 MMOL/ML IV SOLN COMPARISON:  Brain MRI 01/11/2023. FINDINGS: MRI HEAD FINDINGS Brain: No age advanced or lobar predominant parenchymal atrophy. Multifocal T2 FLAIR hyperintense signal abnormality within the bilateral juxtacortical and deep periventricular cerebral white matter, as well as pons, compatible with the provided history of multiple sclerosis. As compared to the prior brain MRI of 01/11/2023, a few new lesions are identified within the cerebral white matter. Most notably, there is a new 12 mm lesion within the right frontoparietal periventricular white matter (for instance as seen on series 9, image 29) and a new 15 mm lesion within the left parietal periventricular white matter (for instance as seen on series 9, image 26). No pathologic intracranial enhancement to suggest active demyelination. There is no acute infarct. No evidence of an intracranial mass. No chronic intracranial blood products. No extra-axial fluid collection. No midline shift. Vascular: Maintained flow voids within the proximal large arterial vessels. Skull and upper cervical spine: Abnormal T1 hypointense marrow signal within visualized portions of the cervical spine. MRV HEAD FINDINGS The superior sagittal sinus, internal cerebral veins, vein of Galen, straight sinus, transverse sinuses, sigmoid sinuses and visualized jugular veins are patent. There is no appreciable intracranial venous thrombosis. Narrowed appearance of the distal transverse dural venous sinuses bilaterally. MRI ORBITS FINDINGS Orbits: The globes are normal in size and contour. Vertical tortuosity of the optic nerves bilaterally. The extraocular muscles, optic nerve sheath complexes and lacrimal glands are  otherwise symmetric and unremarkable. No orbital mass or pathologic orbital enhancement. Specifically, there is no evidence of acute optic neuritis. Visualized sinuses: Mild mucosal thickening within the right maxillary sinus. Mild mucosal thickening, and small mucous retention cyst, within the left maxillary sinus. Soft tissues: The visualized maxillofacial and upper neck soft tissues are unremarkable. IMPRESSION: MRI brain: 1. No evidence of an acute intracranial abnormality. 2. Multifocal T2 FLAIR hyperintense signal abnormality within the cerebral white matter and pons consistent with the provided history of multiple sclerosis. As compared to the prior brain MRI of 01/11/2023, a few new lesions are identified within the cerebral white matter (measuring up to 15 mm). No pathologic intracranial enhancement to suggest active demyelination. 3. Abnormal T1 hypointense marrow signal within imaged portions of the cervical spine. While this finding can reflect a marrow infiltrative process, the most common causes include chronic anemia, smoking and obesity. MRV head: 1. Narrowed appearance of the distal transverse dural venous sinuses bilaterally. This finding can be seen in the setting of chronic idiopathic intracranial hypertension (pseudotumor cerebri). 2. No evidence of dural venous sinus thrombosis. MRI orbits: 1. Vertical tortuosity of the optic nerves bilaterally. This finding can be seen in the setting of chronic idiopathic intracranial hypertension (pseudotumor cerebri). 2. Otherwise unremarkable MRI appearance of the orbits.  3. Mild bilateral maxillary sinus disease. Electronically Signed   By: Jackey Loge D.O.   On: 08/14/2023 12:56   MR ORBITS W WO CONTRAST Result Date: 08/14/2023 CLINICAL DATA:  Provided history: Optic neuritis suspected. Additional history obtained from electronic MEDICAL RECORD NUMBERHistory of multiple sclerosis. EXAM: MRI HEAD AND ORBITS WITHOUT AND WITH CONTRAST TECHNIQUE: Multiplanar,  multiecho pulse sequences of the brain and surrounding structures were obtained without and with intravenous contrast. Multiplanar, multiecho pulse sequences of the orbits and surrounding structures were obtained including fat saturation techniques, before and after intravenous contrast administration. Angiographic images of the intracranial venous structures were acquired using MRV technique without and with intravenous contrast. CONTRAST:  10mL GADAVIST GADOBUTROL 1 MMOL/ML IV SOLN COMPARISON:  Brain MRI 01/11/2023. FINDINGS: MRI HEAD FINDINGS Brain: No age advanced or lobar predominant parenchymal atrophy. Multifocal T2 FLAIR hyperintense signal abnormality within the bilateral juxtacortical and deep periventricular cerebral white matter, as well as pons, compatible with the provided history of multiple sclerosis. As compared to the prior brain MRI of 01/11/2023, a few new lesions are identified within the cerebral white matter. Most notably, there is a new 12 mm lesion within the right frontoparietal periventricular white matter (for instance as seen on series 9, image 29) and a new 15 mm lesion within the left parietal periventricular white matter (for instance as seen on series 9, image 26). No pathologic intracranial enhancement to suggest active demyelination. There is no acute infarct. No evidence of an intracranial mass. No chronic intracranial blood products. No extra-axial fluid collection. No midline shift. Vascular: Maintained flow voids within the proximal large arterial vessels. Skull and upper cervical spine: Abnormal T1 hypointense marrow signal within visualized portions of the cervical spine. MRV HEAD FINDINGS The superior sagittal sinus, internal cerebral veins, vein of Galen, straight sinus, transverse sinuses, sigmoid sinuses and visualized jugular veins are patent. There is no appreciable intracranial venous thrombosis. Narrowed appearance of the distal transverse dural venous sinuses  bilaterally. MRI ORBITS FINDINGS Orbits: The globes are normal in size and contour. Vertical tortuosity of the optic nerves bilaterally. The extraocular muscles, optic nerve sheath complexes and lacrimal glands are otherwise symmetric and unremarkable. No orbital mass or pathologic orbital enhancement. Specifically, there is no evidence of acute optic neuritis. Visualized sinuses: Mild mucosal thickening within the right maxillary sinus. Mild mucosal thickening, and small mucous retention cyst, within the left maxillary sinus. Soft tissues: The visualized maxillofacial and upper neck soft tissues are unremarkable. IMPRESSION: MRI brain: 1. No evidence of an acute intracranial abnormality. 2. Multifocal T2 FLAIR hyperintense signal abnormality within the cerebral white matter and pons consistent with the provided history of multiple sclerosis. As compared to the prior brain MRI of 01/11/2023, a few new lesions are identified within the cerebral white matter (measuring up to 15 mm). No pathologic intracranial enhancement to suggest active demyelination. 3. Abnormal T1 hypointense marrow signal within imaged portions of the cervical spine. While this finding can reflect a marrow infiltrative process, the most common causes include chronic anemia, smoking and obesity. MRV head: 1. Narrowed appearance of the distal transverse dural venous sinuses bilaterally. This finding can be seen in the setting of chronic idiopathic intracranial hypertension (pseudotumor cerebri). 2. No evidence of dural venous sinus thrombosis. MRI orbits: 1. Vertical tortuosity of the optic nerves bilaterally. This finding can be seen in the setting of chronic idiopathic intracranial hypertension (pseudotumor cerebri). 2. Otherwise unremarkable MRI appearance of the orbits. 3. Mild bilateral maxillary sinus disease. Electronically Signed  By: Jackey Loge D.O.   On: 08/14/2023 12:56   MR MRV HEAD W WO CONTRAST Result Date: 08/14/2023 CLINICAL  DATA:  Provided history: Optic neuritis suspected. Additional history obtained from electronic MEDICAL RECORD NUMBERHistory of multiple sclerosis. EXAM: MRI HEAD AND ORBITS WITHOUT AND WITH CONTRAST TECHNIQUE: Multiplanar, multiecho pulse sequences of the brain and surrounding structures were obtained without and with intravenous contrast. Multiplanar, multiecho pulse sequences of the orbits and surrounding structures were obtained including fat saturation techniques, before and after intravenous contrast administration. Angiographic images of the intracranial venous structures were acquired using MRV technique without and with intravenous contrast. CONTRAST:  10mL GADAVIST GADOBUTROL 1 MMOL/ML IV SOLN COMPARISON:  Brain MRI 01/11/2023. FINDINGS: MRI HEAD FINDINGS Brain: No age advanced or lobar predominant parenchymal atrophy. Multifocal T2 FLAIR hyperintense signal abnormality within the bilateral juxtacortical and deep periventricular cerebral white matter, as well as pons, compatible with the provided history of multiple sclerosis. As compared to the prior brain MRI of 01/11/2023, a few new lesions are identified within the cerebral white matter. Most notably, there is a new 12 mm lesion within the right frontoparietal periventricular white matter (for instance as seen on series 9, image 29) and a new 15 mm lesion within the left parietal periventricular white matter (for instance as seen on series 9, image 26). No pathologic intracranial enhancement to suggest active demyelination. There is no acute infarct. No evidence of an intracranial mass. No chronic intracranial blood products. No extra-axial fluid collection. No midline shift. Vascular: Maintained flow voids within the proximal large arterial vessels. Skull and upper cervical spine: Abnormal T1 hypointense marrow signal within visualized portions of the cervical spine. MRV HEAD FINDINGS The superior sagittal sinus, internal cerebral veins, vein of Galen,  straight sinus, transverse sinuses, sigmoid sinuses and visualized jugular veins are patent. There is no appreciable intracranial venous thrombosis. Narrowed appearance of the distal transverse dural venous sinuses bilaterally. MRI ORBITS FINDINGS Orbits: The globes are normal in size and contour. Vertical tortuosity of the optic nerves bilaterally. The extraocular muscles, optic nerve sheath complexes and lacrimal glands are otherwise symmetric and unremarkable. No orbital mass or pathologic orbital enhancement. Specifically, there is no evidence of acute optic neuritis. Visualized sinuses: Mild mucosal thickening within the right maxillary sinus. Mild mucosal thickening, and small mucous retention cyst, within the left maxillary sinus. Soft tissues: The visualized maxillofacial and upper neck soft tissues are unremarkable. IMPRESSION: MRI brain: 1. No evidence of an acute intracranial abnormality. 2. Multifocal T2 FLAIR hyperintense signal abnormality within the cerebral white matter and pons consistent with the provided history of multiple sclerosis. As compared to the prior brain MRI of 01/11/2023, a few new lesions are identified within the cerebral white matter (measuring up to 15 mm). No pathologic intracranial enhancement to suggest active demyelination. 3. Abnormal T1 hypointense marrow signal within imaged portions of the cervical spine. While this finding can reflect a marrow infiltrative process, the most common causes include chronic anemia, smoking and obesity. MRV head: 1. Narrowed appearance of the distal transverse dural venous sinuses bilaterally. This finding can be seen in the setting of chronic idiopathic intracranial hypertension (pseudotumor cerebri). 2. No evidence of dural venous sinus thrombosis. MRI orbits: 1. Vertical tortuosity of the optic nerves bilaterally. This finding can be seen in the setting of chronic idiopathic intracranial hypertension (pseudotumor cerebri). 2. Otherwise  unremarkable MRI appearance of the orbits. 3. Mild bilateral maxillary sinus disease. Electronically Signed   By: Jackey Loge D.O.  On: 08/14/2023 12:56      Assessment and Plan: * Multiple sclerosis (HCC) This is chronic, patient chronically maintained on ofatumumab and predsnione.  At this time no evidence of flare.  I suspect patient's minimal lymphocytic pleocytosis of CSF is due to her multiple sclerosis itself.  However patient is pending viral encephalitis panel as well as rule out fungal meningitis.  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  Patient had LP done today with improvement in CSF pressure.  Patient has no gait abnormality.  Look forward to input from neurology in this case as well.  I will check cortisol and TSH in the morning.       Advance Care Planning:   Code Status: Full Code   Consults: Neurology from [facility] has been consulted in this case to advise on the pleocytosis of the CSF.  We look forward to their evaluation.  Family Communication: mom at bedside, all questions answered.  Severity of Illness: The appropriate patient status for this patient is OBSERVATION. Observation status is judged to be reasonable and necessary in order to provide the required intensity of service to ensure the patient's safety. The patient's presenting symptoms, physical exam findings, and initial radiographic and laboratory data in the context of their medical condition is felt to place them at decreased risk for further clinical deterioration. Furthermore, it is anticipated that the patient will be medically stable for discharge from the hospital within 2 midnights of admission.   Author: Nolberto Hanlon, MD 08/14/2023 6:24 PM  For on call review [email or web address].

## 2023-08-15 DIAGNOSIS — H4710 Unspecified papilledema: Secondary | ICD-10-CM | POA: Diagnosis not present

## 2023-08-15 LAB — PROTIME-INR
INR: 1 (ref 0.8–1.2)
Prothrombin Time: 13.5 s (ref 11.4–15.2)

## 2023-08-15 LAB — MENINGITIS/ENCEPHALITIS PANEL (CSF)

## 2023-08-15 LAB — CBC
HCT: 37 % (ref 36.0–46.0)
Hemoglobin: 11.8 g/dL — ABNORMAL LOW (ref 12.0–15.0)
MCH: 27.5 pg (ref 26.0–34.0)
MCHC: 31.9 g/dL (ref 30.0–36.0)
MCV: 86.2 fL (ref 80.0–100.0)
Platelets: 353 10*3/uL (ref 150–400)
RBC: 4.29 MIL/uL (ref 3.87–5.11)
RDW: 14.3 % (ref 11.5–15.5)
WBC: 6.5 10*3/uL (ref 4.0–10.5)
nRBC: 0 % (ref 0.0–0.2)

## 2023-08-15 LAB — CORTISOL-AM, BLOOD: Cortisol - AM: 3.5 ug/dL — ABNORMAL LOW (ref 6.7–22.6)

## 2023-08-15 LAB — BASIC METABOLIC PANEL
Anion gap: 6 (ref 5–15)
BUN: 16 mg/dL (ref 6–20)
CO2: 24 mmol/L (ref 22–32)
Calcium: 9 mg/dL (ref 8.9–10.3)
Chloride: 110 mmol/L (ref 98–111)
Creatinine, Ser: 1.02 mg/dL — ABNORMAL HIGH (ref 0.44–1.00)
GFR, Estimated: >60 mL/min (ref >60)
Glucose, Bld: 88 mg/dL (ref 70–99)
Potassium: 4 mmol/L (ref 3.5–5.1)
Sodium: 140 mmol/L (ref 135–145)

## 2023-08-15 LAB — TSH: TSH: 3.081 u[IU]/mL (ref 0.350–4.500)

## 2023-08-15 LAB — APTT: aPTT: 32 s (ref 24–36)

## 2023-08-15 NOTE — Progress Notes (Addendum)
Patient discharged.  Removed PIV.  Reviewed discharge instructions, medications and follow up appts with patient and family member. There were no prescriptions.  Answered questions.  Gave patient copy of discharge instructions.    No additional questions or concerns at this time.  Patient refused wheelchair.  Walked out.

## 2023-08-15 NOTE — Plan of Care (Signed)

## 2023-08-15 NOTE — TOC CM/SW Note (Signed)
Transition of Care [facility]) - Inpatient Brief Assessment   Patient Details  Name: Dana Woods MRN: [id number] Date of Birth: 2000-11-23  Transition of Care [facility]) CM/SW Contact:    Gala Lewandowsky, RN Phone Number: 08/15/2023, 9:24 AM   Clinical Narrative: Patient presented for eye pressure. Patient has PCP and insurance. No home needs identified at this time. Patient will transition home today.     Transition of Care Asessment: Insurance and Status: Insurance coverage has been reviewed Patient has primary care physician: Yes Prior/Current Home Services: No current home services Social Drivers of Health Review: SDOH reviewed no interventions necessary Readmission risk has been reviewed: Yes Transition of care needs: no transition of care needs at this time

## 2023-08-15 NOTE — Discharge Summary (Signed)
Physician Discharge Summary   Patient: Dana Woods MRN: [id number] DOB: [birth year]  Admit date:     08/14/2023  Discharge date: 08/15/23  Discharge Physician: Jonah Blue   PCP: Grayce Sessions, NP   Recommendations at discharge:   Take acetazolamide 500 ng twice daily Follow up with Dr. Epimenio Foot at [facility] Follow up with ophthalmology for tracking of your response to acetazolamide on dilated retinal exam and for Goldmann perimetry Follow up with NP Edwards in 1-2 weeks  Discharge Diagnoses: Principal Problem:   Papilledema Active Problems:   Morbid obesity (HCC)   Multiple sclerosis [facility])    Hospital Course: 22yo with h/o MS on disease modifying therapy who presented on 12/23 due to papilledema found at an eye exam.   MRI with a few new lesions but no active demyelination.  MRV with pseudotumor cerebri, started on acetazolamide.  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 Epimenio Foot of [facility] as well as with Ophthalmology for tracking of her response to acetazolamide on dilated retinal exam, as well as for Goldmann perimetry  Multiple sclerosis  Patient has known MS, chronically maintained on ofatumumab and predsnione At this time no evidence of flare Negative viral/fungal studies CSF VDRL is pending Ok to dc to home  Morbid obesity Body mass index is 50.68 kg/m.Marland Kitchen  Weight loss should be encouraged Outpatient PCP/bariatric medicine/bariatric surgery f/u encouraged      Consultants: Neurology  Procedures: LP  Antibiotics: None      Pain control - [place] Controlled Substance Reporting System database was  reviewed. and patient was instructed, not to drive, operate heavy machinery, perform activities at heights, swimming or participation in water activities or provide baby-sitting services while on Pain, Sleep and Anxiety Medications; until their outpatient Physician has advised to do so again. Also recommended to not to take more than prescribed Pain, Sleep and Anxiety Medications.    Disposition: Home Diet recommendation:  Regular diet DISCHARGE MEDICATION: Allergies as of 08/15/2023       Reactions   Gadavist [gadobutrol] Nausea Only   Nausea right after contrast was given. No other reaction.    Nickel Rash        Medication List     STOP taking these medications    phentermine 37.5 MG capsule       TAKE these medications    Kesimpta 20 MG/0.4ML Soaj Generic drug: Ofatumumab Inject 20 mg into the skin once a week.   predniSONE 50 MG tablet Commonly known as: DELTASONE Take one po the night before your Kesimpta injection        Discharge Exam:    Subjective: Feeling ok, no back pain after LP, wants to go home.   Objective: Vitals:   08/15/23 0803 08/15/23 0839  BP: (!) 96/51 (!) 95/48  Pulse: 69 72  Resp: 18 18  Temp: 97.7 F (36.5 C) 97.6 F (36.4 C)  SpO2: 100% 100%   No intake or output data in the 24 hours ending 08/15/23 0849 Filed Weights   08/14/23 0825  Weight: (!) 142.4 kg    Exam:  General:  Appears calm and comfortable and is in NAD Eyes:  EOMI, normal lids, iris ENT:  grossly  normal hearing, lips & tongue, mmm Neck:  no LAD, masses or thyromegaly Cardiovascular:  RRR, no m/r/g. No LE edema.  Respiratory:   CTA bilaterally with no wheezes/rales/rhonchi.  Normal respiratory effort. Abdomen:  soft, NT, ND Skin:  no rash or induration seen on limited exam Musculoskeletal:  grossly normal tone BUE/BLE, good ROM, no bony abnormality Psychiatric:  blunted mood and affect, speech fluent and appropriate, AOx3 Neurologic:  CN 2-12 grossly  intact, moves all extremities in coordinated fashion  Data Reviewed: I have reviewed the patient's lab results since admission.  Pertinent labs for today include:  Unremarkable CBC Hgb 11.8 INR 1 TSH 3.081 CSF 13 WBC, culture NTD CSF viral panel negative CSF Cryptococcal Ag negative    Condition at discharge: stable  The results of significant diagnostics from this hospitalization (including imaging, microbiology, ancillary and laboratory) are listed below for reference.   Imaging Studies: DG Lumbar Puncture Fluoro Guide Result Date: 08/14/2023 CLINICAL DATA:  Papilledema. Concern for idiopathic intracranial hypertension. EXAM: DIAGNOSTIC LUMBAR PUNCTURE UNDER FLUOROSCOPIC GUIDANCE FLUOROSCOPY: Radiation Exposure Index (as provided by the fluoroscopic device): 17.8 PROCEDURE: Informed consent was obtained from the patient prior to the procedure, including potential complications of headache, allergy, and pain. With the patient prone, the lower back was prepped with Betadine. 1% Lidocaine was used for local anesthesia. Lumbar puncture was performed at the L2-L3 level using a 20 gauge needle with return of clear CSF with an opening pressure of 32 cm water. 12 ml of CSF were obtained for laboratory studies. The patient tolerated the procedure well and there were no apparent complications. IMPRESSION: 1. Successful lumbar puncture. 2. Elevated opening pressure at 32 cm water. 3. Closing pressure improved to 22 cm water Electronically Signed   By: Genevive Bi M.D.   On: 08/14/2023 16:32   MR Brain W and Wo Contrast Result Date: 08/14/2023 CLINICAL DATA:  Provided history: Optic neuritis suspected. Additional history obtained from electronic MEDICAL RECORD NUMBERHistory of multiple sclerosis. EXAM: MRI HEAD AND ORBITS WITHOUT AND WITH CONTRAST TECHNIQUE: Multiplanar, multiecho pulse sequences of the brain and surrounding structures were obtained without and with intravenous contrast. Multiplanar,  multiecho pulse sequences of the orbits and surrounding structures were obtained including fat saturation techniques, before and after intravenous contrast administration. Angiographic images of the intracranial venous structures were acquired using MRV technique without and with intravenous contrast. CONTRAST:  10mL GADAVIST GADOBUTROL 1 MMOL/ML IV SOLN COMPARISON:  Brain MRI 01/11/2023. FINDINGS: MRI HEAD FINDINGS Brain: No age advanced or lobar predominant parenchymal atrophy. Multifocal T2 FLAIR hyperintense signal abnormality within the bilateral juxtacortical and deep periventricular cerebral white matter, as well as pons, compatible with the provided history of multiple sclerosis. As compared to the prior brain MRI of 01/11/2023, a few new lesions are identified within the cerebral white matter. Most notably, there is a new 12 mm lesion within the right frontoparietal periventricular white matter (for instance as seen on series 9, image 29) and a new 15 mm lesion within the left parietal periventricular white matter (for instance as seen on series 9, image 26). No pathologic intracranial enhancement to suggest active demyelination. There is no acute infarct. No evidence of an intracranial mass. No chronic intracranial blood products. No extra-axial fluid collection. No midline shift. Vascular: Maintained flow voids within the proximal large arterial vessels. Skull and upper cervical spine: Abnormal T1 hypointense marrow signal within visualized portions of the cervical spine. MRV HEAD FINDINGS The superior sagittal sinus, internal cerebral veins, vein of Galen,  straight sinus, transverse sinuses, sigmoid sinuses and visualized jugular veins are patent. There is no appreciable intracranial venous thrombosis. Narrowed appearance of the distal transverse dural venous sinuses bilaterally. MRI ORBITS FINDINGS Orbits: The globes are normal in size and contour. Vertical tortuosity of the optic nerves bilaterally.  The extraocular muscles, optic nerve sheath complexes and lacrimal glands are otherwise symmetric and unremarkable. No orbital mass or pathologic orbital enhancement. Specifically, there is no evidence of acute optic neuritis. Visualized sinuses: Mild mucosal thickening within the right maxillary sinus. Mild mucosal thickening, and small mucous retention cyst, within the left maxillary sinus. Soft tissues: The visualized maxillofacial and upper neck soft tissues are unremarkable. IMPRESSION: MRI brain: 1. No evidence of an acute intracranial abnormality. 2. Multifocal T2 FLAIR hyperintense signal abnormality within the cerebral white matter and pons consistent with the provided history of multiple sclerosis. As compared to the prior brain MRI of 01/11/2023, a few new lesions are identified within the cerebral white matter (measuring up to 15 mm). No pathologic intracranial enhancement to suggest active demyelination. 3. Abnormal T1 hypointense marrow signal within imaged portions of the cervical spine. While this finding can reflect a marrow infiltrative process, the most common causes include chronic anemia, smoking and obesity. MRV head: 1. Narrowed appearance of the distal transverse dural venous sinuses bilaterally. This finding can be seen in the setting of chronic idiopathic intracranial hypertension (pseudotumor cerebri). 2. No evidence of dural venous sinus thrombosis. MRI orbits: 1. Vertical tortuosity of the optic nerves bilaterally. This finding can be seen in the setting of chronic idiopathic intracranial hypertension (pseudotumor cerebri). 2. Otherwise unremarkable MRI appearance of the orbits. 3. Mild bilateral maxillary sinus disease. Electronically Signed   By: Jackey Loge D.O.   On: 08/14/2023 12:56   MR ORBITS W WO CONTRAST Result Date: 08/14/2023 CLINICAL DATA:  Provided history: Optic neuritis suspected. Additional history obtained from electronic MEDICAL RECORD NUMBERHistory of multiple  sclerosis. EXAM: MRI HEAD AND ORBITS WITHOUT AND WITH CONTRAST TECHNIQUE: Multiplanar, multiecho pulse sequences of the brain and surrounding structures were obtained without and with intravenous contrast. Multiplanar, multiecho pulse sequences of the orbits and surrounding structures were obtained including fat saturation techniques, before and after intravenous contrast administration. Angiographic images of the intracranial venous structures were acquired using MRV technique without and with intravenous contrast. CONTRAST:  10mL GADAVIST GADOBUTROL 1 MMOL/ML IV SOLN COMPARISON:  Brain MRI 01/11/2023. FINDINGS: MRI HEAD FINDINGS Brain: No age advanced or lobar predominant parenchymal atrophy. Multifocal T2 FLAIR hyperintense signal abnormality within the bilateral juxtacortical and deep periventricular cerebral white matter, as well as pons, compatible with the provided history of multiple sclerosis. As compared to the prior brain MRI of 01/11/2023, a few new lesions are identified within the cerebral white matter. Most notably, there is a new 12 mm lesion within the right frontoparietal periventricular white matter (for instance as seen on series 9, image 29) and a new 15 mm lesion within the left parietal periventricular white matter (for instance as seen on series 9, image 26). No pathologic intracranial enhancement to suggest active demyelination. There is no acute infarct. No evidence of an intracranial mass. No chronic intracranial blood products. No extra-axial fluid collection. No midline shift. Vascular: Maintained flow voids within the proximal large arterial vessels. Skull and upper cervical spine: Abnormal T1 hypointense marrow signal within visualized portions of the cervical spine. MRV HEAD FINDINGS The superior sagittal sinus, internal cerebral veins, vein of Galen, straight sinus, transverse sinuses, sigmoid sinuses and visualized jugular  veins are patent. There is no appreciable intracranial  venous thrombosis. Narrowed appearance of the distal transverse dural venous sinuses bilaterally. MRI ORBITS FINDINGS Orbits: The globes are normal in size and contour. Vertical tortuosity of the optic nerves bilaterally. The extraocular muscles, optic nerve sheath complexes and lacrimal glands are otherwise symmetric and unremarkable. No orbital mass or pathologic orbital enhancement. Specifically, there is no evidence of acute optic neuritis. Visualized sinuses: Mild mucosal thickening within the right maxillary sinus. Mild mucosal thickening, and small mucous retention cyst, within the left maxillary sinus. Soft tissues: The visualized maxillofacial and upper neck soft tissues are unremarkable. IMPRESSION: MRI brain: 1. No evidence of an acute intracranial abnormality. 2. Multifocal T2 FLAIR hyperintense signal abnormality within the cerebral white matter and pons consistent with the provided history of multiple sclerosis. As compared to the prior brain MRI of 01/11/2023, a few new lesions are identified within the cerebral white matter (measuring up to 15 mm). No pathologic intracranial enhancement to suggest active demyelination. 3. Abnormal T1 hypointense marrow signal within imaged portions of the cervical spine. While this finding can reflect a marrow infiltrative process, the most common causes include chronic anemia, smoking and obesity. MRV head: 1. Narrowed appearance of the distal transverse dural venous sinuses bilaterally. This finding can be seen in the setting of chronic idiopathic intracranial hypertension (pseudotumor cerebri). 2. No evidence of dural venous sinus thrombosis. MRI orbits: 1. Vertical tortuosity of the optic nerves bilaterally. This finding can be seen in the setting of chronic idiopathic intracranial hypertension (pseudotumor cerebri). 2. Otherwise unremarkable MRI appearance of the orbits. 3. Mild bilateral maxillary sinus disease. Electronically Signed   By: Jackey Loge D.O.    On: 08/14/2023 12:56   MR MRV HEAD W WO CONTRAST Result Date: 08/14/2023 CLINICAL DATA:  Provided history: Optic neuritis suspected. Additional history obtained from electronic MEDICAL RECORD NUMBERHistory of multiple sclerosis. EXAM: MRI HEAD AND ORBITS WITHOUT AND WITH CONTRAST TECHNIQUE: Multiplanar, multiecho pulse sequences of the brain and surrounding structures were obtained without and with intravenous contrast. Multiplanar, multiecho pulse sequences of the orbits and surrounding structures were obtained including fat saturation techniques, before and after intravenous contrast administration. Angiographic images of the intracranial venous structures were acquired using MRV technique without and with intravenous contrast. CONTRAST:  10mL GADAVIST GADOBUTROL 1 MMOL/ML IV SOLN COMPARISON:  Brain MRI 01/11/2023. FINDINGS: MRI HEAD FINDINGS Brain: No age advanced or lobar predominant parenchymal atrophy. Multifocal T2 FLAIR hyperintense signal abnormality within the bilateral juxtacortical and deep periventricular cerebral white matter, as well as pons, compatible with the provided history of multiple sclerosis. As compared to the prior brain MRI of 01/11/2023, a few new lesions are identified within the cerebral white matter. Most notably, there is a new 12 mm lesion within the right frontoparietal periventricular white matter (for instance as seen on series 9, image 29) and a new 15 mm lesion within the left parietal periventricular white matter (for instance as seen on series 9, image 26). No pathologic intracranial enhancement to suggest active demyelination. There is no acute infarct. No evidence of an intracranial mass. No chronic intracranial blood products. No extra-axial fluid collection. No midline shift. Vascular: Maintained flow voids within the proximal large arterial vessels. Skull and upper cervical spine: Abnormal T1 hypointense marrow signal within visualized portions of the cervical spine. MRV  HEAD FINDINGS The superior sagittal sinus, internal cerebral veins, vein of Galen, straight sinus, transverse sinuses, sigmoid sinuses and visualized jugular veins are patent. There is no appreciable  intracranial venous thrombosis. Narrowed appearance of the distal transverse dural venous sinuses bilaterally. MRI ORBITS FINDINGS Orbits: The globes are normal in size and contour. Vertical tortuosity of the optic nerves bilaterally. The extraocular muscles, optic nerve sheath complexes and lacrimal glands are otherwise symmetric and unremarkable. No orbital mass or pathologic orbital enhancement. Specifically, there is no evidence of acute optic neuritis. Visualized sinuses: Mild mucosal thickening within the right maxillary sinus. Mild mucosal thickening, and small mucous retention cyst, within the left maxillary sinus. Soft tissues: The visualized maxillofacial and upper neck soft tissues are unremarkable. IMPRESSION: MRI brain: 1. No evidence of an acute intracranial abnormality. 2. Multifocal T2 FLAIR hyperintense signal abnormality within the cerebral white matter and pons consistent with the provided history of multiple sclerosis. As compared to the prior brain MRI of 01/11/2023, a few new lesions are identified within the cerebral white matter (measuring up to 15 mm). No pathologic intracranial enhancement to suggest active demyelination. 3. Abnormal T1 hypointense marrow signal within imaged portions of the cervical spine. While this finding can reflect a marrow infiltrative process, the most common causes include chronic anemia, smoking and obesity. MRV head: 1. Narrowed appearance of the distal transverse dural venous sinuses bilaterally. This finding can be seen in the setting of chronic idiopathic intracranial hypertension (pseudotumor cerebri). 2. No evidence of dural venous sinus thrombosis. MRI orbits: 1. Vertical tortuosity of the optic nerves bilaterally. This finding can be seen in the setting of  chronic idiopathic intracranial hypertension (pseudotumor cerebri). 2. Otherwise unremarkable MRI appearance of the orbits. 3. Mild bilateral maxillary sinus disease. Electronically Signed   By: Jackey Loge D.O.   On: 08/14/2023 12:56    Microbiology: Results for orders placed or performed during the hospital encounter of 08/14/23  CSF culture w Stat Gram Stain     Status: None (Preliminary result)   Collection Time: 08/14/23  3:26 PM   Specimen: CSF; Cerebrospinal Fluid  Result Value Ref Range Status   Specimen Description   Final    CSF Performed at [facility], [address], [place], [place] [address]    Special Requests   Final    NONE Performed at [facility], [address], [place], [place] [address]    Gram Stain   Final    WBC SEEN NO ORGANISMS SEEN CYTOSPIN SMEAR Performed at [facility], [address] [place], [place] [address]    Culture   Final    NO GROWTH < 12 HOURS Performed at [facility], [address], [place], [place] [address]    Report Status PENDING  Incomplete    Labs: CBC: Recent Labs  Lab 08/11/23 1541 08/14/23 1050 08/15/23 0420  WBC 6.5 7.8 6.5  NEUTROABS 3.9 4.9  --   HGB 12.5 12.3 11.8*  HCT 39.9 39.8 37.0  MCV 87.5 87.9 86.2  PLT 347 365 353   Basic Metabolic Panel: Recent Labs  Lab 08/11/23 1541 08/14/23 1050 08/15/23 0420  NA 137 139 140  K 4.2 3.7 4.0  CL 108 108 110  CO2 21* 23 24  GLUCOSE 91 78 88  BUN 14 17 16   CREATININE 0.76 0.85 1.02*  CALCIUM 9.0 9.0 9.0   Liver Function Tests: Recent Labs  Lab 08/14/23 1050  AST 13*  ALT 15  ALKPHOS 65  BILITOT 0.5  PROT 7.1  ALBUMIN 3.6   CBG: No results for input(s): "GLUCAP" in the last 168 hours.  Discharge time  spent: greater than 30 minutes.  Signed: Jonah Blue, MD [facility] 08/15/2023

## 2023-08-15 NOTE — Hospital Course (Signed)
22yo with h/o MS on disease modifying therapy who presented on 12/23 due to papilledema found at an eye exam.   MRI with a few new lesions but no active demyelination.  MRV with pseudotumor cerebri, started on acetazolamide.

## 2023-08-15 NOTE — Plan of Care (Signed)
CSF meningitis/encephalitis panel is negative. Cryptococcal antigen is negative. CSF VDRL is pending.   The patient can be discharged home on acetazolamide 500 mg BID.  Will need outpatient follow ups with Dr. Epimenio Foot of [facility] as well as with Ophthalmology for tracking of her response to acetazolamide on dilated retinal exam, as well as for Goldmann perimetry.  Electronically signed: Dr. Caryl Pina

## 2023-08-17 ENCOUNTER — Telehealth: Payer: Self-pay

## 2023-08-17 NOTE — Transitions of Care (Post Inpatient/ED Visit) (Signed)
   08/17/2023  Name: Dana Woods MRN: [id number] DOB: 2001-01-25  Today's TOC FU Call Status: Today's TOC FU Call Status:: Unsuccessful Call (1st Attempt) Unsuccessful Call (1st Attempt) Date: 08/17/23  Attempted to reach the patient regarding the most recent Inpatient/ED visit.  Follow Up Plan: Additional outreach attempts will be made to reach the patient to complete the Transitions of Care (Post Inpatient/ED visit) call.   Signature  Robyne Peers, RN

## 2023-08-18 LAB — CSF CULTURE W GRAM STAIN: Culture: NO GROWTH

## 2023-08-18 LAB — VDRL, CSF: VDRL Quant, CSF: NONREACTIVE

## 2023-08-21 ENCOUNTER — Telehealth: Payer: Self-pay

## 2023-08-21 NOTE — Transitions of Care (Post Inpatient/ED Visit) (Signed)
   08/21/2023  Name: Dana Woods MRN: [id number] DOB: 2000-10-05  Today's TOC FU Call Status: Today's TOC FU Call Status:: Unsuccessful Call (2nd Attempt) Unsuccessful Call (1st Attempt) Date: 08/17/23 Unsuccessful Call (2nd Attempt) Date: 08/21/23  Attempted to reach the patient regarding the most recent Inpatient/ED visit.  Follow Up Plan: Additional outreach attempts will be made to reach the patient to complete the Transitions of Care (Post Inpatient/ED visit) call.   Signature Robyne Peers, RN

## 2023-08-22 ENCOUNTER — Telehealth: Payer: Self-pay

## 2023-08-22 NOTE — Transitions of Care (Post Inpatient/ED Visit) (Signed)
   08/22/2023  Name: Dana Woods MRN: [id number] DOB: November 30, 2000  Today's TOC FU Call Status: Today's TOC FU Call Status:: Unsuccessful Call (3rd Attempt) Unsuccessful Call (1st Attempt) Date: 08/17/23 Unsuccessful Call (2nd Attempt) Date: 08/21/23 Unsuccessful Call (3rd Attempt) Date: 08/22/23  Attempted to reach the patient regarding the most recent Inpatient/ED visit.  Follow Up Plan: No further outreach attempts will be made at this time. We have been unable to contact the patient.  Letter sent to patient requesting she contact [facility] to schedule a follow up appointment as we have not been able to reach her.   Signature  Slater Diesel, RN

## 2023-09-14 LAB — FUNGUS CULTURE WITH STAIN

## 2023-09-14 LAB — FUNGUS CULTURE RESULT

## 2023-09-14 LAB — FUNGAL ORGANISM REFLEX

## 2023-09-19 ENCOUNTER — Ambulatory Visit: Payer: Medicaid Other | Admitting: Obstetrics and Gynecology

## 2023-11-06 ENCOUNTER — Ambulatory Visit (INDEPENDENT_AMBULATORY_CARE_PROVIDER_SITE_OTHER): Payer: Self-pay | Admitting: Primary Care

## 2023-11-06 NOTE — Telephone Encounter (Signed)
 noted

## 2023-11-06 NOTE — Telephone Encounter (Signed)
  Chief Complaint: blood mixed in stool x 2 days  Symptoms: blood mixed in stool and commode water Saturday and Sunday . Denies constipation but strained with BM on Saturday . Blood mixed with brown colored stool and noted in commode. Taking new injection Kesimpta and last dose Saturday  Frequency: Saturday and Sunday  Pertinent Negatives: Patient denies rectal bleeding today . No dizziness no abdominal pain , no V/D no fever Disposition: [] ED /[] Urgent Care (no appt availability in office) / [x] Appointment(In office/virtual)/ []  [facility]/ [] Home Care/ [] Refused Recommended Disposition /[] [facility]/ []  Follow-up with PCP Additional Notes:   No available appt with PCP within 3 days. Ist available 11/16/23. Appt scheduled at [facility] for 11/07/23 same day acute.  Please advise if patient can see PCP.     Copied from CRM [id number]. Topic: Clinical - Red Word Triage >> Nov 06, 2023  8:40 AM Runell Gess P wrote: Reason for CRM: patient has had blood in her stools this past weekend. Reason for Disposition  MILD rectal bleeding (more than just a few drops or streaks)  Answer Assessment - Initial Assessment Questions 1. APPEARANCE of BLOOD: "What color is it?" "Is it passed separately, on the surface of the stool, or mixed in with the stool?"      Bright red blood mixed in with stool per patient mother on DPR 2. AMOUNT: "How much blood was passed?"      "A lot " 3. FREQUENCY: "How many times has blood been passed with the stools?"      2 Saturday and Sunday  4. ONSET: "When was the blood first seen in the stools?" (Days or weeks)      Saturday  5. DIARRHEA: "Is there also some diarrhea?" If Yes, ask: "How many diarrhea stools in the past 24 hours?"      Denies  6. CONSTIPATION: "Do you have constipation?" If Yes, ask: "How bad is it?"     Denies but did strain with BM on Saturday  7. RECURRENT SYMPTOMS: "Have you had blood in your stools before?" If Yes, ask: "When was the  last time?" and "What happened that time?"      No  8. BLOOD THINNERS: "Do you take any blood thinners?" (e.g., Coumadin/warfarin, Pradaxa/dabigatran, aspirin)     no 9. OTHER SYMPTOMS: "Do you have any other symptoms?"  (e.g., abdomen pain, vomiting, dizziness, fever)     Blood mixed in stool Saturday and Sunday .  10. PREGNANCY: "Is there any chance you are pregnant?" "When was your last menstrual period?"       na  Protocols used: Rectal Bleeding-A-AH

## 2023-11-07 ENCOUNTER — Ambulatory Visit: Payer: Self-pay | Admitting: Internal Medicine

## 2023-11-07 ENCOUNTER — Encounter: Payer: Self-pay | Admitting: Internal Medicine

## 2023-11-07 VITALS — BP 108/74 | HR 115 | Temp 98.1°F | Ht 66.0 in | Wt 324.6 lb

## 2023-11-07 DIAGNOSIS — E559 Vitamin D deficiency, unspecified: Secondary | ICD-10-CM

## 2023-11-07 DIAGNOSIS — K5909 Other constipation: Secondary | ICD-10-CM | POA: Insufficient documentation

## 2023-11-07 DIAGNOSIS — Z6841 Body Mass Index (BMI) 40.0 and over, adult: Secondary | ICD-10-CM

## 2023-11-07 DIAGNOSIS — E66813 Obesity, class 3: Secondary | ICD-10-CM | POA: Insufficient documentation

## 2023-11-07 DIAGNOSIS — G35 Multiple sclerosis: Secondary | ICD-10-CM | POA: Diagnosis not present

## 2023-11-07 DIAGNOSIS — K921 Melena: Secondary | ICD-10-CM | POA: Insufficient documentation

## 2023-11-07 DIAGNOSIS — Z23 Encounter for immunization: Secondary | ICD-10-CM | POA: Diagnosis not present

## 2023-11-07 DIAGNOSIS — D649 Anemia, unspecified: Secondary | ICD-10-CM | POA: Insufficient documentation

## 2023-11-07 LAB — HEMOCCULT GUIAC POC 1CARD (OFFICE): Fecal Occult Blood, POC: POSITIVE — AB

## 2023-11-07 NOTE — Patient Instructions (Signed)
Rectal Bleeding    Rectal bleeding is when blood comes out of the opening of the butt (anus). You may see bright red blood in your underwear or in the toilet after you poop (have a bowel movement). You may also have blood mixed with your poop (stool), or dark red or black poop.  Rectal bleeding is often a sign that something is wrong. It can be caused by many things. It needs to be checked by a doctor.  Follow these instructions at home:  Medicines  Take over-the-counter and prescription medicines only as told by your doctor.  Ask your doctor about changing or stopping your normal medicines. These include blood thinners.  Managing constipation    Your condition may cause trouble pooping (constipation). To prevent or treat this, or to help make your poop soft, you may need to:  Drink enough fluid to keep your pee (urine) pale yellow.  Take over-the-counter or prescription medicines.  Eat foods that are high in fiber. These include beans, whole grains, and fresh fruits and vegetables.  Limit foods that are high in fat and sugar. These include fried or sweet foods.     General instructions  Try not to strain when you poop.  Take a warm bath. This may help with pain.  Watch for changes in your symptoms.  Contact a doctor if:  You have pain or swelling in your belly (abdomen).  You have a fever.  You feel weak or like you may vomit.  You cannot poop.  You have new or more bleeding.  You have black or dark red poop.  You vomit blood or something that looks like coffee grounds.  Get help right away if:  You faint.  You have very bad pain in your butt.  These symptoms may be an emergency. Get help right away. Call 911.  Do not wait to see if the symptoms will go away.  Do not drive yourself to the hospital.  This information is not intended to replace advice given to you by your health care provider. Make sure you discuss any questions you have with your health care provider.  Document Revised: 03/29/2022 Document Reviewed:  03/29/2022  Elsevier Patient Education  2024 [employer].

## 2023-11-07 NOTE — Assessment & Plan Note (Signed)
 This is a new issue. Rectal exam performed today, brown stool heme positive. She is advised to avoid popcorn and nuts. Agrees to GI evaluation, referral placed. Ddx includes hemorrhoids, fissures, colitis, IBD - less likely cancerous condition.

## 2023-11-07 NOTE — Progress Notes (Signed)
 I,Victoria T Deloria Lair, CMA,acting as a Neurosurgeon for Gwynneth Aliment, MD.,have documented all relevant documentation on the behalf of Gwynneth Aliment, MD,as directed by  Gwynneth Aliment, MD while in the presence of Gwynneth Aliment, MD.  Subjective:  Patient ID: Dana Woods , female    DOB: 09-05-2000 , 23 y.o.   MRN: [id number]  Chief Complaint  Patient presents with   Blood In Stools    HPI  Patient presents today for blood in stool. She was scheduled by [facility], she is accompanied by her Mother today. She states this initially started on Saturday. She does have h/o constipation. She has had blood in her stools daily since last Saturday.  She admits having still some blood in her stool today. This is the first time this has happened. There is no associated abdominal pain, fever/chills or weight loss.   She was diagnosed last May for MS. This new diagnosis has made it difficult for her to work.   She also had her gall bladder removed a year ago.   Rectal Bleeding  The current episode started 3 to 5 days ago. The onset was sudden. The problem occurs frequently. The problem has been unchanged. The stool is described as hard. There was no prior successful therapy. Pertinent negatives include no abdominal pain.     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 No other reaction.    Nickel Rash     Review of Systems  Constitutional: Negative.   Respiratory: Negative.    Cardiovascular: Negative.   Gastrointestinal:  Positive for constipation and hematochezia. Negative for abdominal distention and abdominal pain.  Neurological: Negative.   Psychiatric/Behavioral: Negative.       Today's Vitals   11/07/23 1614  BP: 108/74  Pulse: (!) 115  Temp: 98.1 F (36.7 C)  SpO2: 98%  Weight: (!) 324 lb 9.6 oz (147.2 kg)  Height: 5\' 6"  (1.676 m)   Body mass index is 52.39 kg/m.  Wt Readings from Last 3 Encounters:  11/07/23 (!) 324 lb 9.6 oz (147.2 kg)  08/14/23 (!) 314 lb (142.4 kg)  08/11/23 (!) 314 lb (142.4 kg)     Objective:  Physical Exam Vitals and nursing note reviewed. Exam conducted with a chaperone present.  Constitutional:      Appearance: Normal appearance. She is obese.  HENT:     Head: Normocephalic and atraumatic.  Eyes:     Extraocular Movements: Extraocular movements intact.  Cardiovascular:     Rate and Rhythm: Normal rate and regular rhythm.     Heart sounds: Normal heart sounds.  Pulmonary:  Effort: Pulmonary effort is normal.     Breath sounds: Normal breath sounds.  Abdominal:     General: Bowel sounds are normal.     Palpations: Abdomen is soft.     Comments: Obese, soft  Genitourinary:    Rectum: Normal. Guaiac result positive. No mass, tenderness or anal fissure.     Comments: Exam was difficult due to clenching of anal sphincter. Mass was not appreciated with limited exam Musculoskeletal:     Cervical back: Normal range of motion.  Skin:    General: Skin is warm.  Neurological:     General: No focal deficit present.     Mental Status: She is alert.  Psychiatric:        Mood and Affect: Mood normal.        Behavior: Behavior normal.         Assessment And Plan:  Blood in stool Assessment & Plan: This is a new issue. Rectal  exam performed today, brown stool heme positive. She is advised to avoid popcorn and nuts. Agrees to GI evaluation, referral placed. Ddx includes hemorrhoids, fissures, colitis, IBD - less likely cancerous condition.   Orders: -     POCT occult blood stool -     Ambulatory referral to Gastroenterology  Anemia, unspecified type Assessment & Plan: Recent labs reviewed, will check labs as below. She does mention h/o menorrhagia. She is followed by GYN.   Orders: -     CBC -     Iron, TIBC and Ferritin Panel -     Vitamin B12  Chronic constipation Assessment & Plan: There is no associated abdominal discomfort, reports having hard stools. Would likely benefit from every other day dosing of stool softener, increase to daily dosing if ineffective. Will leave further mgmt to PCP.    Multiple sclerosis (HCC) Assessment & Plan: Recently diagnosed, followed by Neuro.    Vitamin D deficiency -     VITAMIN D 25 Hydroxy (Vit-D Deficiency, Fractures)  Class 3 severe obesity due to excess calories without serious comorbidity with body mass index (BMI) of 50.0 to 59.9 in adult [facility]) Assessment & Plan: BMI 52. She is encouraged to incorporate more exercise into her daily routine. Encouraged to incorporate more fruits/veggies into her diet.    Immunization due -     Flu vaccine trivalent PF, 6mos and older(Flulaval,Afluria,Fluarix,Fluzone)  She is encouraged to strive for BMI less than 30 to decrease cardiac risk. Advised to aim for at least 150 minutes of exercise per week.    Return if symptoms worsen or fail to improve.  Patient was given opportunity to ask questions. Patient verbalized understanding of the plan and was able to repeat key elements of the plan. All questions were answered to their satisfaction.    I, Gwynneth Aliment, MD, have reviewed all documentation for this visit. The documentation on 11/07/23 for the exam, diagnosis, procedures, and orders are all accurate and  complete.   IF YOU HAVE BEEN REFERRED TO A SPECIALIST, IT MAY TAKE 1-2 WEEKS TO SCHEDULE/PROCESS THE REFERRAL. IF YOU HAVE NOT HEARD FROM US/SPECIALIST IN TWO WEEKS, PLEASE GIVE [place] A CALL AT [phone].   THE PATIENT IS ENCOURAGED TO PRACTICE SOCIAL DISTANCING DUE TO THE COVID-19 PANDEMIC.

## 2023-11-07 NOTE — Assessment & Plan Note (Signed)
 BMI 52. She is encouraged to incorporate more exercise into her daily routine. Encouraged to incorporate more fruits/veggies into her diet.

## 2023-11-07 NOTE — Assessment & Plan Note (Signed)
 Recent labs reviewed, will check labs as below. She does mention h/o menorrhagia. She is followed by GYN.

## 2023-11-07 NOTE — Assessment & Plan Note (Signed)
 There is no associated abdominal discomfort, reports having hard stools. Would likely benefit from every other day dosing of stool softener, increase to daily dosing if ineffective. Will leave further mgmt to PCP.

## 2023-11-07 NOTE — Assessment & Plan Note (Signed)
 Recently diagnosed, followed by Neuro.

## 2023-11-08 ENCOUNTER — Encounter: Payer: Self-pay | Admitting: Internal Medicine

## 2023-11-08 ENCOUNTER — Other Ambulatory Visit: Payer: Self-pay | Admitting: Internal Medicine

## 2023-11-08 LAB — VITAMIN B12: Vitamin B-12: 605 pg/mL (ref 232–1245)

## 2023-11-08 LAB — IRON,TIBC AND FERRITIN PANEL
Ferritin: 50 ng/mL (ref 15–150)
Iron Saturation: 19 % (ref 15–55)
Iron: 66 ug/dL (ref 27–159)
Total Iron Binding Capacity: 350 ug/dL (ref 250–450)
UIBC: 284 ug/dL (ref 131–425)

## 2023-11-08 LAB — CBC
Hematocrit: 40 % (ref 34.0–46.6)
Hemoglobin: 12.8 g/dL (ref 11.1–15.9)
MCH: 27.9 pg (ref 26.6–33.0)
MCHC: 32 g/dL (ref 31.5–35.7)
MCV: 87 fL (ref 79–97)
Platelets: 427 10*3/uL (ref 150–450)
RBC: 4.59 x10E6/uL (ref 3.77–5.28)
RDW: 13.3 % (ref 11.7–15.4)
WBC: 9.4 10*3/uL (ref 3.4–10.8)

## 2023-11-08 LAB — VITAMIN D 25 HYDROXY (VIT D DEFICIENCY, FRACTURES): Vit D, 25-Hydroxy: 8.7 ng/mL — ABNORMAL LOW (ref 30.0–100.0)

## 2023-11-08 MED ORDER — VITAMIN D (ERGOCALCIFEROL) 1.25 MG (50000 UNIT) PO CAPS
50000.0000 [IU] | ORAL_CAPSULE | ORAL | 0 refills | Status: DC
Start: 2023-11-08 — End: 2023-12-18

## 2023-11-29 ENCOUNTER — Telehealth: Payer: Self-pay | Admitting: Neurology

## 2023-11-29 NOTE — Telephone Encounter (Signed)
 [facility] ([place]) Check to see if you had a alternate number, have been trying to contact patietn to delivery Ofatumumab (KESIMPTA) 20 MG/0.4ML SOAJ. If you could notify the patient we are attempting to contact her.

## 2023-12-14 ENCOUNTER — Telehealth: Payer: Self-pay | Admitting: Neurology

## 2023-12-14 NOTE — Telephone Encounter (Signed)
 MYC conf

## 2023-12-18 ENCOUNTER — Encounter: Payer: Self-pay | Admitting: Neurology

## 2023-12-18 ENCOUNTER — Ambulatory Visit: Payer: Medicaid Other | Admitting: Neurology

## 2023-12-18 VITALS — BP 117/83 | HR 90 | Ht 66.0 in | Wt 330.0 lb

## 2023-12-18 DIAGNOSIS — Z79899 Other long term (current) drug therapy: Secondary | ICD-10-CM

## 2023-12-18 DIAGNOSIS — G932 Benign intracranial hypertension: Secondary | ICD-10-CM

## 2023-12-18 DIAGNOSIS — H4710 Unspecified papilledema: Secondary | ICD-10-CM | POA: Diagnosis not present

## 2023-12-18 DIAGNOSIS — G35 Multiple sclerosis: Secondary | ICD-10-CM | POA: Diagnosis not present

## 2023-12-18 DIAGNOSIS — Z6841 Body Mass Index (BMI) 40.0 and over, adult: Secondary | ICD-10-CM

## 2023-12-18 DIAGNOSIS — E66813 Obesity, class 3: Secondary | ICD-10-CM

## 2023-12-18 MED ORDER — VITAMIN D (ERGOCALCIFEROL) 1.25 MG (50000 UNIT) PO CAPS: 50000.0000 [IU] | ORAL_CAPSULE | ORAL | 3 refills | Status: DC

## 2023-12-18 MED ORDER — ACETAZOLAMIDE ER 500 MG PO CP12: 500.0000 mg | ORAL_CAPSULE | Freq: Two times a day (BID) | ORAL | 11 refills | Status: AC

## 2023-12-18 MED ORDER — SERTRALINE HCL 50 MG PO TABS: 50.0000 mg | ORAL_TABLET | Freq: Every day | ORAL | 3 refills | Status: AC

## 2023-12-18 NOTE — Progress Notes (Signed)
 [facility]  PATIENT: Dana Woods DOB: Dec 26, 2000  REFERRING DOCTOR OR PCP: Madelyn Schick, NP SOURCE: Patient, mother, notes from emergency rooms and admission, imaging and lab reports, MRI images personally reviewed.  MRI images were also shown to the patient and her mother  _________________________________   HISTORICAL  CHIEF COMPLAINT:  Chief Complaint  Patient presents with   Follow-up    Pt in room 10. Mom in room. Here for MS follow up. On Kesimpta . Pt reports doing well, no symptoms. Last eye appointment was Dec 2024.    HISTORY OF PRESENT ILLNESS:  Dana Woods is a 23 y.o. woman with multiple sclerosis.  Update 12/18/2023 She had tolerability issues with the first dose of Kesimpta  early September, 2024.  She had chills and shaking.  Those symptoms lasted about 90 minutes.   When she woke up, she felt better.   She is taking 50 mg prednisone  bfore each shot and has not had any further issues.  She was noted on eye exam to have papilledema in December 2025 and was sent to the emergency room.  She was admitted, she received IV steroids for possible exacerbation.  She also had a lumbar puncture that showed elevated intracranial pressure.  It looks like Diamox was prescribed but she never started.  Currently, she feels gait is doing well, close to baseline.   She holds the rail on stairs going down. She keeps up with others on long walks and could walk > 1 mile without issue   Strength is now normal.  Numbness is resolved.    She has nocturia once a night    She notes fatigue and sleepiness.  She is sleepy some during the day.    She falls asleep easily but wakes up some nights. She does not snore or have OSA signs.  She notes mild depression and mild anxiety.     She denies change in cognition. However, sometimes auditory processing seems slower and she asks people to repeat.   She is stressed not having a job now.     Vitamin D  deficiency was noted.  MS  History: She had the onset of left leg numbness around Jan 09, 2023.   The numbness increased and went up to her lower abdomen over a couple days and weakness.  She was working overtime and iniitally felt she was just overdoing it.  Due to the increase in symptoms, she presented to the emergency room 01/11/2023  The numbness started in the foot and went up the leg to the pelvis.  The weakness was mild in the left leg.   She was treated with 5 days of IV Solu-Medrol  with improvement of symptoms by discharge.   She improved further after discharge.      While in the hospital, she had a urine culture showing Proteus mirabilis treated with amoxicillin .  She denies any other episode of neurologic symptoms. MRI c/w MS.   A T-spine lesion (T7-T8) enhanced c/w acute demyelination and likely source of her symptoms.    She had started Kesimpta  June 2024.  She took one dose and had chills so decided not to take any more.  She restarted Kesimpta  in October 2024, premedicating with prednisone  1 day before each shot.  MRI December 2024 showed two lesions not present in May 2024 (though these could have occurred when she was not on a disease modifying therapy)   Imaging: MRI of the head 08/14/2023 showed two  lesions not noted on  the 01/11/2023 MRI.  None of them enhanced.  MRI of the head 01/11/2023 showed multiple T2/FLAIR hyperintense foci in the periventricular, juxtacortical and deep white matter of the cerebral hemispheres.  Additional focus is noted in the left mid pons.  None of the foci enhance.  MRI of the cervical, thoracic and lumbar spine 01/11/2023 showed a T2 hyperintense focus adjacent to T7-T8 towards the left that enhanced with contrast.  The study was otherwise normal.  Other medical issues: Depression, asthma, hyperlipidemia  Laboratory: Vitamin D  was markedly low at 6.  Anti-NMO antibody and anti-MOG antibodies were negative.  HIV was negative.  REVIEW OF SYSTEMS: Constitutional: No fevers,  chills, sweats, or change in appetite Eyes: No visual changes, double vision, eye pain Ear, nose and throat: No hearing loss, ear pain, nasal congestion, sore throat Cardiovascular: No chest pain, palpitations Respiratory:  No shortness of breath at rest or with exertion.   No wheezes GastrointestinaI: No nausea, vomiting, diarrhea, abdominal pain, fecal incontinence Genitourinary:  No dysuria, urinary retention or frequency.  No nocturia. Musculoskeletal:  No neck pain, back pain Integumentary: No rash, pruritus, skin lesions Neurological: as above Psychiatric: No depression at this time.  No anxiety Endocrine: No palpitations, diaphoresis, change in appetite, change in weigh or increased thirst Hematologic/Lymphatic:  No anemia, purpura, petechiae. Allergic/Immunologic: No itchy/runny eyes, nasal congestion, recent allergic reactions, rashes  ALLERGIES: Allergies  Allergen Reactions   Gadavist  [Gadobutrol ] Nausea Only    Nausea right after contrast was given. No other reaction.    Nickel Rash    HOME MEDICATIONS:  Current Outpatient Medications:    acetaZOLAMIDE ER (DIAMOX) 500 MG capsule, Take 1 capsule (500 mg total) by mouth 2 (two) times daily., Disp: 60 capsule, Rfl: 11   Ofatumumab  (KESIMPTA ) 20 MG/0.4ML SOAJ, Inject 20 mg into the skin once a week., Disp: , Rfl:    predniSONE  (DELTASONE ) 50 MG tablet, Take one po the night before your Kesimpta  injection, Disp: 12 tablet, Rfl: 0   sertraline (ZOLOFT) 50 MG tablet, Take 1 tablet (50 mg total) by mouth daily., Disp: 90 tablet, Rfl: 3   Vitamin D , Ergocalciferol , (DRISDOL ) 1.25 MG (50000 UNIT) CAPS capsule, Take 1 capsule (50,000 Units total) by mouth every 7 (seven) days., Disp: 13 capsule, Rfl: 3  PAST MEDICAL HISTORY: Past Medical History:  Diagnosis Date   Asthma    At risk for diabetes mellitus 12/20/2009   HbA1c 5.7   Hypercholesteremia 12/20/2009   LDL 116, totat 181   Irregular menses    Multiple sclerosis (HCC)     Myopia of both eyes 12/21/2011   has glasses   Obesity 08/23/2007   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    PAST SURGICAL HISTORY: Past Surgical History:  Procedure Laterality Date   CHOLECYSTECTOMY Bilateral 10/18/2022   Procedure: LAPAROSCOPIC CHOLECYSTECTOMY WITH INTRAOPERATIVE CHOLANGIOGRAM;  Surgeon: Aldean Hummingbird, MD;  Location: [facility];  Service: General;  Laterality: Bilateral;    FAMILY HISTORY: Family History  Problem Relation Age of Onset   ADD / ADHD Sister     SOCIAL HISTORY: Social History   Socioeconomic History   Marital status: Single    Spouse name: Not on file   Number of children: Not on file   Years of education: Not on file   Highest education level: Not on file  Occupational History   Not on file  Tobacco Use   Smoking status: Former    Types: Cigars    Quit date: 07/20/2023  Years since quitting: 0.4   Smokeless tobacco: Never  Vaping Use   Vaping status: Every Day   Start date: 12/17/2021  Substance and Sexual Activity   Alcohol use: Yes    Comment: socal drinking   Drug use: Never   Sexual activity: Yes    Partners: Male, Female    Birth control/protection: None, Condom  Other Topics Concern   Not on file  Social History Narrative   Lives with mom, and sister Demetria. Mom smokes inside.Dad lives nearby with new wife.      Right Handed   2 Sodas per Day    Social Drivers of Health   Financial Resource Strain: Not on file  Food Insecurity: No Food Insecurity (08/15/2023)   Hunger Vital Sign    Worried About Running Out of Food in the Last Year: Never true    Ran Out of Food in the Last Year: Never true  Transportation Needs: No Transportation Needs (08/15/2023)   PRAPARE - Administrator, Civil Service (Medical): No    Lack of Transportation (Non-Medical): No  Physical Activity: Not on file  Stress: Not on file  Social Connections: Not on file  Intimate Partner Violence: Not At Risk (08/15/2023)   Humiliation,  Afraid, Rape, and Kick questionnaire    Fear of Current or Ex-Partner: No    Emotionally Abused: No    Physically Abused: No    Sexually Abused: No       PHYSICAL EXAM  Vitals:   12/18/23 1127 12/18/23 1136  BP: (!) 151/118 117/83  Pulse: 90   Weight: (!) 330 lb (149.7 kg)   Height: 5\' 6"  (1.676 m)      Body mass index is 53.26 kg/m.   General: The patient is well-developed and well-nourished and in no acute distress  HEENT:  Head is [place]/AT.  Sclera are anicteric.     Skin: Extremities are without rash or  edema.  Musculoskeletal:  Back is nontender  Neurologic Exam  Mental status: The patient is alert and oriented x 3 at the time of the examination. The patient has apparent normal recent and remote memory, with an apparently normal attention span and concentration ability.   Speech is normal.  Cranial nerves: Extraocular movements are full.  Facial strength and sensation was normal.  Trapezius and sternocleidomastoid strength is normal. No dysarthria is noted.  . No obvious hearing deficits are noted.  Motor:  Muscle bulk is normal.   Tone is normal. Strength is  5 / 5 in all 4 extremities.   Sensory: Sensory testing is intact to pinprick, soft touch and vibration sensation in all 4 extremities.    Coordination: Cerebellar testing reveals good finger-nose-finger and heel-to-shin bilaterally.  Gait and station: Station is normal.   Gait is normal. Tandem gait is mildly wide. Romberg is negative.   Reflexes: Deep tendon reflexes are symmetric, 2 in the arms, 3 at the knees and 1 at the ankles.Aaron Aas      DIAGNOSTIC DATA (LABS, IMAGING, TESTING) - I reviewed patient records, labs, notes, testing and imaging myself where available.  Lab Results  Component Value Date   WBC 9.4 11/07/2023   HGB 12.8 11/07/2023   HCT 40.0 11/07/2023   MCV 87 11/07/2023   PLT 427 11/07/2023      Component Value Date/Time   NA 140 08/15/2023 0420   K 4.0 08/15/2023 0420   CL 110  08/15/2023 0420   CO2 24 08/15/2023 0420   GLUCOSE  88 08/15/2023 0420   BUN 16 08/15/2023 0420   CREATININE 1.02 (H) 08/15/2023 0420   CALCIUM 9.0 08/15/2023 0420   PROT 7.1 08/14/2023 1050   ALBUMIN 3.6 08/14/2023 1050   AST 13 (L) 08/14/2023 1050   ALT 15 08/14/2023 1050   ALKPHOS 65 08/14/2023 1050   BILITOT 0.5 08/14/2023 1050   GFRNONAA >60 08/15/2023 0420   Lab Results  Component Value Date   CHOL 176 01/12/2023   HDL 51 01/12/2023   LDLCALC 119 (H) 01/12/2023   TRIG 31 01/12/2023   CHOLHDL 3.5 01/12/2023   Lab Results  Component Value Date   HGBA1C 5.0 01/12/2023      ASSESSMENT AND PLAN   Multiple sclerosis (HCC) - Plan: IgG, IgA, IgM, CBC with Differential/Platelet, CD20 B Cells  High risk medication use - Plan: IgG, IgA, IgM, CBC with Differential/Platelet, CD20 B Cells  Papilledema  Idiopathic intracranial hypertension  Class 3 severe obesity due to excess calories without serious comorbidity with body mass index (BMI) of 50.0 to 59.9 in adult (HCC)  Continue Kesimpta  .  Premed with 50 mg po prednisone  the night before each shot Stay active and exercise as tolerated.  We discussed weight loss and exercise. 50,000 U weekly vitamin D  supplementation. 4.  She had elevated CSF pressure in December.   Will add Diamox 5.  She will return to see me in 6 months or sooner if there are new or worsening neurologic symptoms.     Dana Proffit A. Godwin Lat, MD, [facility] 12/18/2023, 12:49 PM Certified in Neurology, Clinical Neurophysiology, Sleep Medicine and Neuroimaging  [facility] [address], [address] [place], [place] [address] [phone]

## 2023-12-20 ENCOUNTER — Encounter: Payer: Self-pay | Admitting: Neurology

## 2023-12-20 LAB — CBC WITH DIFFERENTIAL/PLATELET
Basophils Absolute: 0.1 10*3/uL (ref 0.0–0.2)
Basos: 1 %
EOS (ABSOLUTE): 0.2 10*3/uL (ref 0.0–0.4)
Eos: 3 %
Hematocrit: 39.4 % (ref 34.0–46.6)
Hemoglobin: 12.4 g/dL (ref 11.1–15.9)
Immature Grans (Abs): 0 10*3/uL (ref 0.0–0.1)
Immature Granulocytes: 0 %
Lymphocytes Absolute: 1.8 10*3/uL (ref 0.7–3.1)
Lymphs: 24 %
MCH: 27.9 pg (ref 26.6–33.0)
MCHC: 31.5 g/dL (ref 31.5–35.7)
MCV: 89 fL (ref 79–97)
Monocytes Absolute: 0.5 10*3/uL (ref 0.1–0.9)
Monocytes: 7 %
Neutrophils Absolute: 4.8 10*3/uL (ref 1.4–7.0)
Neutrophils: 65 %
Platelets: 374 10*3/uL (ref 150–450)
RBC: 4.44 x10E6/uL (ref 3.77–5.28)
RDW: 13.3 % (ref 11.7–15.4)
WBC: 7.4 10*3/uL (ref 3.4–10.8)

## 2023-12-20 LAB — IGG, IGA, IGM
IgA/Immunoglobulin A, Serum: 301 mg/dL (ref 87–352)
IgG (Immunoglobin G), Serum: 1226 mg/dL (ref 586–1602)
IgM (Immunoglobulin M), Srm: 54 mg/dL (ref 26–217)

## 2023-12-20 LAB — CD20 B CELLS
% CD19-B Cells: 0.3 % — ABNORMAL LOW (ref 4.6–22.1)
% CD20-B Cells: 0.3 % — ABNORMAL LOW (ref 5.0–22.3)

## 2024-04-11 ENCOUNTER — Telehealth: Payer: Self-pay | Admitting: Neurology

## 2024-04-11 DIAGNOSIS — G35 Multiple sclerosis: Secondary | ICD-10-CM

## 2024-04-11 MED ORDER — KESIMPTA 20 MG/0.4ML ~~LOC~~ SOAJ: 20.0000 mg | SUBCUTANEOUS | 5 refills | Status: AC

## 2024-04-11 NOTE — Telephone Encounter (Signed)
 Last saw Dr. Vear 12/18/23.  Called pt at [phone] to find out what pharmacy. Uses [facility]. I added to pt chart. States last injection 02/20/2024. Aware we will send in refill and ask for them to expedite. Confirmed she had [facility] phone#. She will call them in the morning to f/u and confirm shipment info/set up shipment.  Rx e-scribed

## 2024-04-11 NOTE — Telephone Encounter (Signed)
 Pt reports the prescription for Ofatumumab  (KESIMPTA ) 20 MG/0.4ML SOAJ  has expired, pt asking that a new Rx be sent in to specialty pharmacy for her.

## 2024-05-22 ENCOUNTER — Encounter: Admitting: Family Medicine

## 2024-06-10 ENCOUNTER — Telehealth: Payer: Self-pay

## 2024-06-10 NOTE — Telephone Encounter (Signed)
 Called pt at [phone]. LVM for pt to call.  This is in regards to her rx: Kesimpta . She needs to call pharmacy back at below number.  Phone room: please relay above if she calls. Also sent mychart.

## 2024-06-10 NOTE — Telephone Encounter (Signed)
 SABRA

## 2024-07-30 ENCOUNTER — Ambulatory Visit: Admitting: Neurology

## 2024-07-30 ENCOUNTER — Encounter: Payer: Self-pay | Admitting: Neurology

## 2024-07-30 VITALS — BP 102/65 | HR 96 | Resp 14 | Ht 66.0 in | Wt 326.5 lb

## 2024-07-30 DIAGNOSIS — Z6841 Body Mass Index (BMI) 40.0 and over, adult: Secondary | ICD-10-CM | POA: Diagnosis not present

## 2024-07-30 DIAGNOSIS — E66813 Obesity, class 3: Secondary | ICD-10-CM | POA: Diagnosis not present

## 2024-07-30 DIAGNOSIS — G35A Relapsing-remitting multiple sclerosis: Secondary | ICD-10-CM

## 2024-07-30 DIAGNOSIS — Z79899 Other long term (current) drug therapy: Secondary | ICD-10-CM | POA: Diagnosis not present

## 2024-07-30 DIAGNOSIS — R260 Ataxic gait: Secondary | ICD-10-CM | POA: Insufficient documentation

## 2024-07-30 DIAGNOSIS — G932 Benign intracranial hypertension: Secondary | ICD-10-CM | POA: Insufficient documentation

## 2024-07-30 MED ORDER — VITAMIN D (ERGOCALCIFEROL) 1.25 MG (50000 UNIT) PO CAPS: 50000.0000 [IU] | ORAL_CAPSULE | ORAL | 3 refills | Status: AC

## 2024-07-30 NOTE — Progress Notes (Signed)
 [facility]  PATIENT: Dana Woods DOB: Dec 30, 2000  REFERRING DOCTOR OR PCP: Rosaline Bohr, NP SOURCE: Patient, mother, notes from emergency rooms and admission, imaging and lab reports, MRI images personally reviewed.  MRI images were also shown to the patient and her mother  _________________________________   HISTORICAL  CHIEF COMPLAINT:  Chief Complaint  Patient presents with   Multiple Sclerosis    RM 11, MOTHER PRESENT, MS FOLLOW UP    HISTORY OF PRESENT ILLNESS:  Dana Woods is a 23 y.o. woman with multiple sclerosis.  Update 07/30/2024 She had tolerability issues with the first dose of Kesimpta  early September, 2024.  She had chills and shaking.   She is taking 50 mg prednisone  bfore each shot and has not had any further issues.  We discussed having her try her next injection without prednisone  to see if she is tolerating it better now  Currently, she feels gait is doing well, close to baseline.   She holds the rail on stairs going down. She keeps up with others on long walks and could walk > 1 mile without issue   Strength is now normal.  Numbness is resolved.    She has nocturia once a night    She was noted on eye exam to have papilledema in December 2025 and was sent to the emergency room.  She was admitted, she received IV steroids for possible exacerbation.  She also had a lumbar puncture that showed elevated intracranial pressure.  She is on acetazolamide  and tolerated well.  She feels visual acuity is symmetric.,  Vision is fine.  She notes fatigue and sleepiness.    She falls asleep easily but wakes up some nights. She does not snore or have OSA signs.  She notes mild depression and mild anxiety.     She denies change in cognition. However, sometimes auditory processing seems slower and she asks people to repeat.  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  She gets headaches (occiital on right) most days at work.   She works in  a factory (leisure centre manager).   She stands up straight at work but wears muffs and safety goggles.   She has   Vitamin D  deficiency was noted.  MS History: She had the onset of left leg numbness around Jan 09, 2023.   The numbness increased and went up to her lower abdomen over a couple days and weakness.  She was working overtime and iniitally felt she was just overdoing it.  Due to the increase in symptoms, she presented to the emergency room 01/11/2023  The numbness started in the foot and went up the leg to the pelvis.  The weakness was mild in the left leg.   She was treated with 5 days of IV Solu-Medrol  with improvement of symptoms by discharge.   She improved further after discharge.      While in the hospital, she had a urine culture showing Proteus mirabilis treated with amoxicillin .  She denies any other episode of neurologic symptoms. MRI c/w MS.   A T-spine lesion (T7-T8) enhanced c/w acute demyelination and likely source of her symptoms.    She had started Kesimpta  June 2024.  She took one dose and had chills so decided not to take any more.  She restarted Kesimpta  in October 2024, premedicating with prednisone  1 day before each shot.  MRI December 2024 showed two lesions not present in May 2024 (though these could have occurred when she  was not on a disease modifying therapy)   Imaging: MRI of the head 08/14/2023 showed two  lesions not noted on the 01/11/2023 MRI.  None of them enhanced.  MRI of the head 01/11/2023 showed multiple T2/FLAIR hyperintense foci in the periventricular, juxtacortical and deep white matter of the cerebral hemispheres.  Additional focus is noted in the left mid pons.  None of the foci enhance.  MRI of the cervical, thoracic and lumbar spine 01/11/2023 showed a T2 hyperintense focus adjacent to T7-T8 towards the left that enhanced with contrast.  The study was otherwise normal.  Other medical issues: Depression, asthma, hyperlipidemia  Laboratory: Vitamin D   was markedly low at 6.  Anti-NMO antibody and anti-MOG antibodies were negative.  HIV was negative.  REVIEW OF SYSTEMS: Constitutional: No fevers, chills, sweats, or change in appetite Eyes: No visual changes, double vision, eye pain Ear, nose and throat: No hearing loss, ear pain, nasal congestion, sore throat Cardiovascular: No chest pain, palpitations Respiratory:  No shortness of breath at rest or with exertion.   No wheezes GastrointestinaI: No nausea, vomiting, diarrhea, abdominal pain, fecal incontinence Genitourinary:  No dysuria, urinary retention or frequency.  No nocturia. Musculoskeletal:  No neck pain, back pain Integumentary: No rash, pruritus, skin lesions Neurological: as above Psychiatric: No depression at this time.  No anxiety Endocrine: No palpitations, diaphoresis, change in appetite, change in weigh or increased thirst Hematologic/Lymphatic:  No anemia, purpura, petechiae. Allergic/Immunologic: No itchy/runny eyes, nasal congestion, recent allergic reactions, rashes  ALLERGIES: Allergies  Allergen Reactions   Gadavist  [Gadobutrol ] Nausea Only    Nausea right after contrast was given. No other reaction.    Nickel Rash    HOME MEDICATIONS:  Current Outpatient Medications:    acetaZOLAMIDE  ER (DIAMOX ) 500 MG capsule, Take 1 capsule (500 mg total) by mouth 2 (two) times daily., Disp: 60 capsule, Rfl: 11   Ofatumumab  (KESIMPTA ) 20 MG/0.4ML SOAJ, Inject 20 mg into the skin every 30 (thirty) days., Disp: 0.4 mL, Rfl: 5   predniSONE  (DELTASONE ) 50 MG tablet, Take one po the night before your Kesimpta  injection, Disp: 12 tablet, Rfl: 0   sertraline  (ZOLOFT ) 50 MG tablet, Take 1 tablet (50 mg total) by mouth daily., Disp: 90 tablet, Rfl: 3   Vitamin D , Ergocalciferol , (DRISDOL ) 1.25 MG (50000 UNIT) CAPS capsule, Take 1 capsule (50,000 Units total) by mouth every 7 (seven) days., Disp: 13 capsule, Rfl: 3  PAST MEDICAL HISTORY: Past Medical History:  Diagnosis Date    Asthma    At risk for diabetes mellitus 12/20/2009   HbA1c 5.7   Hypercholesteremia 12/20/2009   LDL 116, totat 181   Irregular menses    Multiple sclerosis    Myopia of both eyes 12/21/2011   has glasses   Obesity 08/23/2007   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    PAST SURGICAL HISTORY: Past Surgical History:  Procedure Laterality Date   CHOLECYSTECTOMY Bilateral 10/18/2022   Procedure: LAPAROSCOPIC CHOLECYSTECTOMY WITH INTRAOPERATIVE CHOLANGIOGRAM;  Surgeon: Tanda Locus, MD;  Location: [facility];  Service: General;  Laterality: Bilateral;    FAMILY HISTORY: Family History  Problem Relation Age of Onset   ADD / ADHD Sister     SOCIAL HISTORY: Social History   Socioeconomic History   Marital status: Single    Spouse name: Not on file   Number of children: Not on file   Years of education: Not on file   Highest education level: Not on file  Occupational History   Not on  file  Tobacco Use   Smoking status: Former    Types: Cigars    Quit date: 07/20/2023    Years since quitting: 1.0   Smokeless tobacco: Never  Vaping Use   Vaping status: Every Day   Start date: 12/17/2021  Substance and Sexual Activity   Alcohol use: Yes    Comment: socal drinking   Drug use: Never   Sexual activity: Yes    Partners: Male, Female    Birth control/protection: None, Condom  Other Topics Concern   Not on file  Social History Narrative   Lives with mom, and sister Demetria. Mom smokes inside.Dad lives nearby with new wife.      Right Handed   2 Sodas per Day    Social Drivers of Health   Financial Resource Strain: Not on file  Food Insecurity: No Food Insecurity (08/15/2023)   Hunger Vital Sign    Worried About Running Out of Food in the Last Year: Never true    Ran Out of Food in the Last Year: Never true  Transportation Needs: No Transportation Needs (08/15/2023)   PRAPARE - Administrator, Civil Service (Medical): No    Lack of Transportation (Non-Medical):  No  Physical Activity: Not on file  Stress: Not on file  Social Connections: Not on file  Intimate Partner Violence: Not At Risk (08/15/2023)   Humiliation, Afraid, Rape, and Kick questionnaire    Fear of Current or Ex-Partner: No    Emotionally Abused: No    Physically Abused: No    Sexually Abused: No       PHYSICAL EXAM  Vitals:   07/30/24 1134  BP: 102/65  Pulse: 96  Resp: 14  SpO2: 98%  Weight: (!) 326 lb 8 oz (148.1 kg)  Height: 5' 6 (1.676 m)     Body mass index is 52.7 kg/m.   General: The patient is well-developed and well-nourished and in no acute distress  HEENT:  Head is [place]/AT.  Sclera are anicteric.     Skin: Extremities are without rash or  edema.  Musculoskeletal:  Back is nontender  Neurologic Exam  Mental status: The patient is alert and oriented x 3 at the time of the examination. The patient has apparent normal recent and remote memory, with an apparently normal attention span and concentration ability.   Speech is normal.  Cranial nerves: Extraocular movements are full.  Facial strength and sensation was normal.  Trapezius and sternocleidomastoid strength is normal. No dysarthria is noted.  . No obvious hearing deficits are noted.  Motor:  Muscle bulk is normal.   Tone is normal. Strength is  5 / 5 in all 4 extremities.   Sensory: Sensory testing is intact to pinprick, soft touch and vibration sensation in all 4 extremities.    Coordination: Cerebellar testing reveals good finger-nose-finger and heel-to-shin bilaterally.  Gait and station: Station is normal.   Gait is normal.  Tandem gait is mildly wide.  Romberg is negative.   Reflexes: Deep tendon reflexes are symmetric, 2 in the arms, 3 at the knees and 1 at the ankles.SABRA      DIAGNOSTIC DATA (LABS, IMAGING, TESTING) - I reviewed patient records, labs, notes, testing and imaging myself where available.  Lab Results  Component Value Date   WBC 7.4 12/18/2023   HGB 12.4 12/18/2023    HCT 39.4 12/18/2023   MCV 89 12/18/2023   PLT 374 12/18/2023      Component Value Date/Time  NA 140 08/15/2023 0420   K 4.0 08/15/2023 0420   CL 110 08/15/2023 0420   CO2 24 08/15/2023 0420   GLUCOSE 88 08/15/2023 0420   BUN 16 08/15/2023 0420   CREATININE 1.02 (H) 08/15/2023 0420   CALCIUM 9.0 08/15/2023 0420   PROT 7.1 08/14/2023 1050   ALBUMIN 3.6 08/14/2023 1050   AST 13 (L) 08/14/2023 1050   ALT 15 08/14/2023 1050   ALKPHOS 65 08/14/2023 1050   BILITOT 0.5 08/14/2023 1050   GFRNONAA >60 08/15/2023 0420   Lab Results  Component Value Date   CHOL 176 01/12/2023   HDL 51 01/12/2023   LDLCALC 119 (H) 01/12/2023   TRIG 31 01/12/2023   CHOLHDL 3.5 01/12/2023   Lab Results  Component Value Date   HGBA1C 5.0 01/12/2023      ASSESSMENT AND PLAN   Relapsing remitting multiple sclerosis - Plan: MR BRAIN W WO CONTRAST, IgG, IgA, IgM, CBC with Differential/Platelet, Hep B Surface Antigen, Hepatitis B Core AB, Total  High risk medication use - Plan: IgG, IgA, IgM, CBC with Differential/Platelet, Hep B Surface Antigen, Hepatitis B Core AB, Total  Idiopathic intracranial hypertension  Class 3 severe obesity due to excess calories without serious comorbidity with body mass index (BMI) of 50.0 to 59.9 in adult [facility])  Ataxic gait  IIH (idiopathic intracranial hypertension)  Continue Kesimpta  .  Premed with 50 mg po prednisone  the night before each shot Stay active and exercise as tolerated.  We discussed weight loss and exercise. 50,000 U weekly vitamin D  supplementation. 4.  She had elevated CSF pressure in December.   Will add Diamox  5.  I encouraged her to continue to lose more weight.  Continue Diamox  for IIH.   She will return to see me in 6 months or sooner if there are new or worsening neurologic symptoms.     Pandora Mccrackin A. Vear, MD, [facility] 07/30/2024, 4:57 PM Certified in Neurology, Clinical Neurophysiology, Sleep Medicine and Neuroimaging  [facility] [address] [place], [place] [address] [phone]

## 2024-07-31 ENCOUNTER — Ambulatory Visit: Payer: Self-pay | Admitting: Neurology

## 2024-07-31 LAB — CBC WITH DIFFERENTIAL/PLATELET
Basophils Absolute: 0.1 x10E3/uL (ref 0.0–0.2)
Basos: 1 %
EOS (ABSOLUTE): 0.2 x10E3/uL (ref 0.0–0.4)
Eos: 3 %
Hematocrit: 38.1 % (ref 34.0–46.6)
Hemoglobin: 11.7 g/dL (ref 11.1–15.9)
Immature Grans (Abs): 0 x10E3/uL (ref 0.0–0.1)
Immature Granulocytes: 0 %
Lymphocytes Absolute: 1.7 x10E3/uL (ref 0.7–3.1)
Lymphs: 24 %
MCH: 27.1 pg (ref 26.6–33.0)
MCHC: 30.7 g/dL — ABNORMAL LOW (ref 31.5–35.7)
MCV: 88 fL (ref 79–97)
Monocytes Absolute: 0.5 x10E3/uL (ref 0.1–0.9)
Monocytes: 7 %
Neutrophils Absolute: 4.7 x10E3/uL (ref 1.4–7.0)
Neutrophils: 65 %
Platelets: 346 x10E3/uL (ref 150–450)
RBC: 4.31 x10E6/uL (ref 3.77–5.28)
RDW: 12.3 % (ref 11.7–15.4)
WBC: 7.1 x10E3/uL (ref 3.4–10.8)

## 2024-07-31 LAB — IGG, IGA, IGM
IgG (Immunoglobin G), Serum: 1416 mg/dL (ref 586–1602)
IgM (Immunoglobulin M), Srm: 64 mg/dL (ref 26–217)
Immunoglobulin A, (IgA) QN, Serum: 319 mg/dL (ref 87–352)

## 2024-07-31 LAB — HEPATITIS B SURFACE ANTIGEN: Hepatitis B Surface Ag: NEGATIVE

## 2024-07-31 LAB — HEPATITIS B CORE ANTIBODY, TOTAL: Hep B Core Total Ab: NEGATIVE

## 2024-08-14 ENCOUNTER — Telehealth: Payer: Self-pay | Admitting: Neurology

## 2024-08-14 NOTE — Telephone Encounter (Signed)
 MRI is scheduled 1/11 at  [facility]. [facility] neomia barrows: [id number] exp. 08/14/24-09/13/24

## 2024-09-01 ENCOUNTER — Ambulatory Visit
Admission: RE | Admit: 2024-09-01 | Discharge: 2024-09-01 | Disposition: A | Source: Ambulatory Visit | Attending: Neurology | Admitting: Neurology

## 2024-09-01 DIAGNOSIS — G35A Relapsing-remitting multiple sclerosis: Secondary | ICD-10-CM

## 2024-09-01 MED ORDER — GADOPICLENOL 0.5 MMOL/ML IV SOLN
10.0000 mL | Freq: Once | INTRAVENOUS | Status: AC | PRN
  Administered 2024-09-01: 10 mL via INTRAVENOUS

## 2025-03-17 ENCOUNTER — Ambulatory Visit: Admitting: Neurology
